# Patient Record
Sex: Male | Born: 2000 | Race: White | Hispanic: No | Marital: Single | Smoking: Never smoker
Health system: Southern US, Community
[De-identification: ages and names within clinical notes are randomized; demographics above are authoritative.]

## PROBLEM LIST (undated history)

## (undated) DIAGNOSIS — R011 Cardiac murmur, unspecified: Secondary | ICD-10-CM

## (undated) DIAGNOSIS — Z889 Allergy status to unspecified drugs, medicaments and biological substances status: Secondary | ICD-10-CM

## (undated) DIAGNOSIS — U071 COVID-19: Secondary | ICD-10-CM

## (undated) DIAGNOSIS — F909 Attention-deficit hyperactivity disorder, unspecified type: Secondary | ICD-10-CM

## (undated) HISTORY — PX: WISDOM TOOTH EXTRACTION: SHX21

## (undated) HISTORY — PX: TONSILLECTOMY: SUR1361

---
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
## 2020-10-03 ENCOUNTER — Other Ambulatory Visit: Payer: Self-pay

## 2020-10-03 ENCOUNTER — Ambulatory Visit
Admission: EM | Admit: 2020-10-03 | Discharge: 2020-10-03 | Discharge status: Against Medical Advice | Payer: PRIVATE HEALTH INSURANCE

## 2021-05-15 ENCOUNTER — Encounter: Payer: Self-pay | Admitting: Emergency Medicine

## 2021-05-15 ENCOUNTER — Other Ambulatory Visit: Payer: Self-pay

## 2021-05-15 ENCOUNTER — Ambulatory Visit
Admission: EM | Admit: 2021-05-15 | Discharge: 2021-05-15 | Disposition: A | Discharge status: Home / Self Care | Payer: 59 | Attending: Internal Medicine | Admitting: Internal Medicine

## 2021-05-15 DIAGNOSIS — H02841 Edema of right upper eyelid: Secondary | ICD-10-CM | POA: Diagnosis not present

## 2021-05-15 MED ORDER — ERYTHROMYCIN 5 MG/GM OP OINT: TOPICAL_OINTMENT | OPHTHALMIC | 0 refills | Status: DC

## 2021-05-15 MED ORDER — AMOXICILLIN-POT CLAVULANATE 875-125 MG PO TABS: 1.0000 | ORAL_TABLET | Freq: Two times a day (BID) | ORAL | 0 refills | Status: DC

## 2021-05-15 NOTE — ED Triage Notes (Signed)
While at work yesterday his right eye began to hurt. Denies having gotten anything in his eye. Woke up this morning with swelling all the way around his right eye to where he can barely open the lids with continued pain. Sclera and conjunctiva do not appear injected, no visible drainage. Denies visual changes aside from the eyelid impediment

## 2021-05-15 NOTE — ED Provider Notes (Signed)
[facility]    CSN: [id number] Arrival date & time: 05/15/21  0919      History   Chief Complaint Chief Complaint  Patient presents with   Facial Swelling    HPI Keith Mcconnell is a 21 y.o. male.   Patient presents with right upper eyelid swelling that started yesterday.  He has associated pain with it as well.  Denies pain with extraocular movement.  Denies any drainage from the eye.  Denies blurry vision.  Denies any trauma or foreign body to the eye.  Patient does not wear contacts.    Past Medical History:  Diagnosis Date   ADHD    COVID-19    Heart murmur    Hx of seasonal allergies     There are no problems to display for this patient.   Past Surgical History:  Procedure Laterality Date   TONSILLECTOMY         Home Medications    Prior to Admission medications   Medication Sig Start Date End Date Taking? Authorizing Provider  amoxicillin-clavulanate (AUGMENTIN) 875-125 MG tablet Take 1 tablet by mouth every 12 (twelve) hours. 05/15/21  Yes Meiya Wisler, Hildred Alamin E, FNP  erythromycin ophthalmic ointment Place a 1/2 inch ribbon of ointment into the eye 4 times daily for 7 days. 05/15/21  Yes Chonte Ricke, Hildred Alamin E, FNP  lisdexamfetamine (VYVANSE) 20 MG capsule Take 20 mg by mouth daily.    [provider]  predniSONE (DELTASONE) 20 MG tablet Take 40 mg by mouth daily for 3 days, then 20mg  by mouth daily for 3 days, then 10mg  daily for 3 days 11/22/19   Larene Pickett, PA-C  triamcinolone cream (KENALOG) 0.1 % Apply 1 application topically 2 (two) times daily. 11/09/19   Ok Edwards, PA-C    Family History Family History  Problem Relation Age of Onset   Healthy Mother    Healthy Father     Social History Social History   Tobacco Use   Smoking status: Never   Smokeless tobacco: Never  Vaping Use   Vaping Use: Some days  Substance Use Topics   Alcohol use: Yes    Comment: occasionally   Drug use: No     Allergies   Patient has no known  allergies.   Review of Systems Review of Systems Per HPI  Physical Exam Triage Vital Signs ED Triage Vitals  Enc Vitals Group     BP 05/15/21 0955 116/74     Pulse Rate 05/15/21 0955 68     Resp 05/15/21 0955 16     Temp 05/15/21 0955 98.4 F (36.9 C)     Temp Source 05/15/21 0955 Oral     SpO2 05/15/21 0955 96 %     Weight --      Height --      Head Circumference --      Peak Flow --      Pain Score 05/15/21 0956 7     Pain Loc --      Pain Edu? --      Excl. in [place]? --    No data found.  Updated Vital Signs BP 116/74 (BP Location: Left Arm)    Pulse 68    Temp 98.4 F (36.9 C) (Oral)    Resp 16    SpO2 96%   Visual Acuity Right Eye Distance: 20/20 Left Eye Distance: 20/25 Bilateral Distance: 20/20  Right Eye Near:   Left Eye Near:    Bilateral Near:  Physical Exam Constitutional:      General: He is not in acute distress.    Appearance: Normal appearance. He is not toxic-appearing or diaphoretic.  HENT:     Head: Normocephalic and atraumatic.  Eyes:     General: Lids are everted, no foreign bodies appreciated. Vision grossly intact. Gaze aligned appropriately.        Right eye: No foreign body or discharge.        Left eye: No foreign body or discharge.     Extraocular Movements: Extraocular movements intact.     Conjunctiva/sclera: Conjunctivae normal.     Pupils: Pupils are equal, round, and reactive to light.     Comments: Sclera and conjunctive are normal.  No drainage from eye.  No pain with extraocular movements for this.  Mild swelling with mild erythema noted to right upper eyelid.  Pulmonary:     Effort: Pulmonary effort is normal.  Neurological:     General: No focal deficit present.     Mental Status: He is alert and oriented to person, place, and time. Mental status is at baseline.  Psychiatric:        Mood and Affect: Mood normal.        Behavior: Behavior normal.        Thought Content: Thought content normal.        Judgment:  Judgment normal.     UC Treatments / Results  Labs (all labs ordered are listed, but only abnormal results are displayed) Labs Reviewed - No data to display  EKG   Radiology No results found.  Procedures Procedures (including critical care time)  Medications Ordered in UC Medications - No data to display  Initial Impression / Assessment and Plan / UC Course  I have reviewed the triage vital signs and the nursing notes.  Pertinent labs & imaging results that were available during my care of the patient were reviewed by me and considered in my medical decision making (see chart for details).     Differential diagnoses include preseptal cellulitis versus hordeolum.  No concern for orbital cellulitis.  There is no obvious stye on exam but there is concern. Will prescribe Augmentin antibiotic as well as erythromycin ointment to cover for both etiologies.  Patient to use compresses as well.  Advised patient to follow-up with eye doctor if symptoms persist or worsen.  Visual acuity is normal today.  Discussed return precautions.  Patient verbalized understanding and was agreeable with plan.  Final diagnoses:  Swelling of right upper eyelid     Discharge Instructions      It appears that you have an infection of your right upper eyelid.  This is being treated with antibiotic ointment as well as antibiotic pills.  Please follow-up with eye doctor if symptoms persist or worsen.    ED Prescriptions     Medication Sig Dispense Auth. Provider   amoxicillin-clavulanate (AUGMENTIN) 875-125 MG tablet Take 1 tablet by mouth every 12 (twelve) hours. 14 tablet [place], [place] E, [place]   erythromycin ophthalmic ointment Place a 1/2 inch ribbon of ointment into the eye 4 times daily for 7 days. 3.5 g Teodora Medici, [place]      PDMP not reviewed this encounter.   Teodora Medici, [place] 05/15/21 1021

## 2021-05-15 NOTE — Discharge Instructions (Signed)
It appears that you have an infection of your right upper eyelid.  This is being treated with antibiotic ointment as well as antibiotic pills.  Please follow-up with eye doctor if symptoms persist or worsen.

## 2023-04-28 ENCOUNTER — Emergency Department (HOSPITAL_BASED_OUTPATIENT_CLINIC_OR_DEPARTMENT_OTHER)
Admission: EM | Admit: 2023-04-28 | Discharge: 2023-04-28 | Disposition: A | Discharge status: Home / Self Care | Payer: Self-pay | Attending: Emergency Medicine | Admitting: Emergency Medicine

## 2023-04-28 ENCOUNTER — Other Ambulatory Visit: Payer: Self-pay

## 2023-04-28 ENCOUNTER — Emergency Department (HOSPITAL_BASED_OUTPATIENT_CLINIC_OR_DEPARTMENT_OTHER): Payer: Self-pay | Admitting: Radiology

## 2023-04-28 ENCOUNTER — Encounter (HOSPITAL_BASED_OUTPATIENT_CLINIC_OR_DEPARTMENT_OTHER): Payer: Self-pay | Admitting: Emergency Medicine

## 2023-04-28 DIAGNOSIS — J101 Influenza due to other identified influenza virus with other respiratory manifestations: Secondary | ICD-10-CM | POA: Insufficient documentation

## 2023-04-28 DIAGNOSIS — Z20822 Contact with and (suspected) exposure to covid-19: Secondary | ICD-10-CM | POA: Insufficient documentation

## 2023-04-28 LAB — BASIC METABOLIC PANEL
Anion gap: 10 (ref 5–15)
BUN: 11 mg/dL (ref 6–20)
CO2: 25 mmol/L (ref 22–32)
Calcium: 10.1 mg/dL (ref 8.9–10.3)
Chloride: 99 mmol/L (ref 98–111)
Creatinine, Ser: 1.13 mg/dL (ref 0.61–1.24)
GFR, Estimated: >60 mL/min (ref >60)
Glucose, Bld: 136 mg/dL — ABNORMAL HIGH (ref 70–99)
Potassium: 4.2 mmol/L (ref 3.5–5.1)
Sodium: 134 mmol/L — ABNORMAL LOW (ref 135–145)

## 2023-04-28 LAB — CBC
HCT: 43 % (ref 39.0–52.0)
Hemoglobin: 14.8 g/dL (ref 13.0–17.0)
MCH: 30.9 pg (ref 26.0–34.0)
MCHC: 34.4 g/dL (ref 30.0–36.0)
MCV: 89.8 fL (ref 80.0–100.0)
Platelets: 164 10*3/uL (ref 150–400)
RBC: 4.79 MIL/uL (ref 4.22–5.81)
RDW: 12.2 % (ref 11.5–15.5)
WBC: 5.6 10*3/uL (ref 4.0–10.5)
nRBC: 0 % (ref 0.0–0.2)

## 2023-04-28 LAB — RESP PANEL BY RT-PCR (RSV, FLU A&B, COVID)  RVPGX2
Influenza A by PCR: POSITIVE — AB
Influenza B by PCR: NEGATIVE
Resp Syncytial Virus by PCR: NEGATIVE
SARS Coronavirus 2 by RT PCR: NEGATIVE

## 2023-04-28 LAB — TROPONIN I (HIGH SENSITIVITY)
Troponin I (High Sensitivity): <2 ng/L (ref <18)
Troponin I (High Sensitivity): 2 ng/L (ref <18)

## 2023-04-28 MED ORDER — ACETAMINOPHEN 500 MG PO TABS: 500.0000 mg | ORAL_TABLET | Freq: Four times a day (QID) | ORAL | 0 refills | Status: AC | PRN

## 2023-04-28 MED ORDER — ACETAMINOPHEN 500 MG PO TABS
1000.0000 mg | ORAL_TABLET | Freq: Once | ORAL | Status: AC
  Administered 2023-04-28: 1000 mg via ORAL
  Filled 2023-04-28: qty 2

## 2023-04-28 MED ORDER — SODIUM CHLORIDE 0.9 % IV BOLUS
1000.0000 mL | Freq: Once | INTRAVENOUS | Status: AC
  Administered 2023-04-28: 1000 mL via INTRAVENOUS

## 2023-04-28 MED ORDER — BENZONATATE 100 MG PO CAPS: 100.0000 mg | ORAL_CAPSULE | Freq: Three times a day (TID) | ORAL | 0 refills | Status: AC

## 2023-04-28 MED ORDER — OSELTAMIVIR PHOSPHATE 75 MG PO CAPS: 75.0000 mg | ORAL_CAPSULE | Freq: Two times a day (BID) | ORAL | 0 refills | Status: DC

## 2023-04-28 NOTE — ED Provider Notes (Signed)
[place] EMERGENCY DEPARTMENT AT [facility] Provider Note   CSN: [id number] Arrival date & time: 04/28/23  1750     History  Chief Complaint  Patient presents with   Cough   Chills    Keith Mcconnell is a 23 y.o. male.  The history is provided by the patient and a parent. No language interpreter was used.  Cough    23 year old male history of heart murmur presenting with flulike symptoms.  Report for the past 2  days he has had a nonproductive cough but today he developed fever, chills, body aches, congestion, decrease in appetite, and feels very rundown.  He denies any recent sick contact.  He denies any vomiting or diarrhea.  He denies any specific treatment tried.  Home Medications Prior to Admission medications   Medication Sig Start Date End Date Taking? Authorizing Provider  amoxicillin-clavulanate (AUGMENTIN) 875-125 MG tablet Take 1 tablet by mouth every 12 (twelve) hours. 05/15/21   Gustavus Bryant, FNP  erythromycin ophthalmic ointment Place a 1/2 inch ribbon of ointment into the eye 4 times daily for 7 days. 05/15/21   Gustavus Bryant, FNP  lisdexamfetamine (VYVANSE) 20 MG capsule Take 20 mg by mouth daily.    [provider]  predniSONE (DELTASONE) 20 MG tablet Take 40 mg by mouth daily for 3 days, then 20mg  by mouth daily for 3 days, then 10mg  daily for 3 days 11/22/19   Garlon Hatchet, PA-C  triamcinolone cream (KENALOG) 0.1 % Apply 1 application topically 2 (two) times daily. 11/09/19   Belinda Fisher, PA-C      Allergies    Patient has no known allergies.    Review of Systems   Review of Systems  Respiratory:  Positive for cough.   All other systems reviewed and are negative.   Physical Exam Updated Vital Signs BP 126/75 (BP Location: Left Arm)   Pulse (!) 137   Temp 99.4 F (37.4 C)   Resp 16   Wt 56 kg   SpO2 99%   BMI 19.91 kg/m  Physical Exam Vitals and nursing note reviewed.  Constitutional:      General: He is not in acute  distress.    Appearance: He is well-developed.  HENT:     Head: Atraumatic.  Eyes:     Conjunctiva/sclera: Conjunctivae normal.  Cardiovascular:     Rate and Rhythm: Tachycardia present.     Pulses: Normal pulses.     Heart sounds: Normal heart sounds.  Pulmonary:     Effort: Pulmonary effort is normal.     Breath sounds: Normal breath sounds. No wheezing, rhonchi or rales.  Abdominal:     Palpations: Abdomen is soft.     Tenderness: There is no abdominal tenderness.  Musculoskeletal:     Cervical back: Neck supple.  Skin:    Findings: No rash.  Neurological:     Mental Status: He is alert.     ED Results / Procedures / Treatments   Labs (all labs ordered are listed, but only abnormal results are displayed) Labs Reviewed  RESP PANEL BY RT-PCR (RSV, FLU A&B, COVID)  RVPGX2 - Abnormal; Notable for the following components:      Result Value   Influenza A by PCR POSITIVE (*)    All other components within normal limits  BASIC METABOLIC PANEL - Abnormal; Notable for the following components:   Sodium 134 (*)    Glucose, Bld 136 (*)    All other components  within normal limits  CBC  TROPONIN I (HIGH SENSITIVITY)  TROPONIN I (HIGH SENSITIVITY)    EKG EKG Interpretation Date/Time:  Wednesday April 28 2023 18:12:18 EST Ventricular Rate:  130 PR Interval:  144 QRS Duration:  146 QT Interval:  384 QTC Calculation: 565 R Axis:   38  Text Interpretation: Sinus tachycardia Non-specific intra-ventricular conduction block  Abnormal ECG  Confirmed by Vonita Moss [phone]) on 04/28/2023 6:55:34 PM  Radiology DG Chest 2 View Result Date: 04/28/2023 CLINICAL DATA:  Tachycardia, fever, chills, and cough. EXAM: CHEST - 2 VIEW COMPARISON:  03/08/2016. FINDINGS: The heart size and mediastinal contours are within normal limits. Both lungs are clear. No acute osseous abnormality. IMPRESSION: No active cardiopulmonary disease. Electronically Signed   By: Thornell Sartorius M.D.   On:  04/28/2023 19:56    Procedures Procedures    Medications Ordered in ED Medications  acetaminophen (TYLENOL) tablet 1,000 mg (1,000 mg Oral Given 04/28/23 1914)  sodium chloride 0.9 % bolus 1,000 mL (0 mLs Intravenous Stopped 04/28/23 2039)    ED Course/ Medical Decision Making/ A&P                                 Medical Decision Making Amount and/or Complexity of Data Reviewed Labs: ordered. Radiology: ordered.  Risk OTC drugs.   BP 126/75 (BP Location: Left Arm)   Pulse (!) 137   Temp 99.4 F (37.4 C)   Resp 16   Wt 56 kg   SpO2 99%   BMI 19.91 kg/m   19:47 PM  23 year old male history of heart murmur presenting with flulike symptoms.  Report for the past 2 to 3 days he has had a nonproductive cough but today he developed fever, chills, body aches, congestion, decrease in appetite, and feels very rundown.  He denies any recent sick contact.  He denies any vomiting or diarrhea.  He denies any specific treatment tried.  Exam notable for tachycardia but patient does not appear dehydrated, no nuchal rigidity concerning for meningitis, lungs otherwise clear no wheezes rales or rhonchi heard.  Vitals are notable for oral temperature of 99.4, heart rate of 137.  Patient is not hypoxic.  -Labs ordered, independently viewed and interpreted by me.  Labs remarkable for influenza A positive -The patient was maintained on a cardiac monitor.  I personally viewed and interpreted the cardiac monitored which showed an underlying rhythm of: sinus tachycardia -Imaging independently viewed and interpreted by me and I agree with radiologist's interpretation.  Result remarkable for CXR unremarkable -This patient presents to the ED for concern of flu sxs, this involves an extensive number of treatment options, and is a complaint that carries with it a high risk of complications and morbidity.  The differential diagnosis includes flu, covid, rsv, pneumonia, viral illness -Co morbidities that  complicate the patient evaluation includes heart murmur -Treatment includes IVF, tylenol -Reevaluation of the patient after these medicines showed that the patient improved -PCP office notes or outside notes reviewed -Escalation to admission/observation considered: patients feels much better, is comfortable with discharge, and will follow up with PCP -Prescription medication considered, patient comfortable with tamiflu and tylenol and tessalone -Social Determinant of Health considered          Final Clinical Impression(s) / ED Diagnoses Final diagnoses:  Influenza A    Rx / DC Orders ED Discharge Orders          Ordered  oseltamivir (TAMIFLU) 75 MG capsule  Every 12 hours        04/28/23 2135    benzonatate (TESSALON) 100 MG capsule  Every 8 hours        04/28/23 2135    acetaminophen (TYLENOL) 500 MG tablet  Every 6 hours PRN        04/28/23 2135              Fayrene Helper, PA-C 04/28/23 2136    Rondel Baton, MD 04/29/23 1524

## 2023-04-28 NOTE — ED Triage Notes (Signed)
Pt via pov from home with fever, chills, cough and high heart rate today. Pt has hx of heart murmur. Pt alert & oriented, nad noted.

## 2023-04-28 NOTE — Discharge Instructions (Addendum)
You have test positive for influenza A.  Take medication prescribed as treatment for your symptoms.  Your condition usually last for 1 to 2 weeks.  Return if you have other concern.

## 2023-06-07 ENCOUNTER — Emergency Department (HOSPITAL_BASED_OUTPATIENT_CLINIC_OR_DEPARTMENT_OTHER): Payer: Self-pay | Admitting: Radiology

## 2023-06-07 ENCOUNTER — Encounter (HOSPITAL_BASED_OUTPATIENT_CLINIC_OR_DEPARTMENT_OTHER): Payer: Self-pay | Admitting: Emergency Medicine

## 2023-06-07 DIAGNOSIS — S82841A Displaced bimalleolar fracture of right lower leg, initial encounter for closed fracture: Secondary | ICD-10-CM | POA: Insufficient documentation

## 2023-06-07 DIAGNOSIS — Y9361 Activity, american tackle football: Secondary | ICD-10-CM | POA: Insufficient documentation

## 2023-06-07 DIAGNOSIS — X509XXA Other and unspecified overexertion or strenuous movements or postures, initial encounter: Secondary | ICD-10-CM | POA: Insufficient documentation

## 2023-06-07 DIAGNOSIS — S82831A Other fracture of upper and lower end of right fibula, initial encounter for closed fracture: Secondary | ICD-10-CM | POA: Insufficient documentation

## 2023-06-07 NOTE — ED Triage Notes (Signed)
 Rt foot/ ankle injury last week Continued pain and bruising/swelling Came into triage with ace wrap and crutches Unable to put pressure on

## 2023-06-08 ENCOUNTER — Emergency Department (HOSPITAL_BASED_OUTPATIENT_CLINIC_OR_DEPARTMENT_OTHER): Payer: Self-pay

## 2023-06-08 ENCOUNTER — Other Ambulatory Visit: Payer: Self-pay

## 2023-06-08 ENCOUNTER — Emergency Department (HOSPITAL_BASED_OUTPATIENT_CLINIC_OR_DEPARTMENT_OTHER)
Admission: EM | Admit: 2023-06-08 | Discharge: 2023-06-08 | Disposition: A | Discharge status: Home / Self Care | Payer: Self-pay | Attending: Emergency Medicine | Admitting: Emergency Medicine

## 2023-06-08 DIAGNOSIS — S82831A Other fracture of upper and lower end of right fibula, initial encounter for closed fracture: Secondary | ICD-10-CM

## 2023-06-08 DIAGNOSIS — S82841A Displaced bimalleolar fracture of right lower leg, initial encounter for closed fracture: Secondary | ICD-10-CM

## 2023-06-08 MED ORDER — OXYCODONE-ACETAMINOPHEN 5-325 MG PO TABS
1.0000 | ORAL_TABLET | Freq: Once | ORAL | Status: AC
  Administered 2023-06-08: 1 via ORAL
  Filled 2023-06-08: qty 1

## 2023-06-08 MED ORDER — OXYCODONE-ACETAMINOPHEN 5-325 MG PO TABS: 1.0000 | ORAL_TABLET | Freq: Four times a day (QID) | ORAL | 0 refills | Status: AC | PRN

## 2023-06-08 NOTE — ED Provider Notes (Signed)
 [place] EMERGENCY DEPARTMENT AT [facility] Provider Note   CSN: [id number] Arrival date & time: 06/07/23  2006     History  Chief Complaint  Patient presents with   Foot Injury    Keith Mcconnell is a 23 y.o. male.   Foot Injury    23 year old male presenting to the emergency department with an ankle and foot injury.  The patient states that he was playing football last week when another individual landed on his right ankle.  He has been unable to bear weight on his right foot or ankle.  He endorses continued pain and bruising with associated swelling.  He denies any other injuries or complaints.  Home Medications Prior to Admission medications   Medication Sig Start Date End Date Taking? Authorizing Provider  oxyCODONE-acetaminophen (PERCOCET/ROXICET) 5-325 MG tablet Take 1 tablet by mouth every 6 (six) hours as needed for severe pain (pain score 7-10). 06/08/23  Yes Ernie Avena, MD  acetaminophen (TYLENOL) 500 MG tablet Take 1 tablet (500 mg total) by mouth every 6 (six) hours as needed. 04/28/23   Fayrene Helper, PA-C  amoxicillin-clavulanate (AUGMENTIN) 875-125 MG tablet Take 1 tablet by mouth every 12 (twelve) hours. 05/15/21   Gustavus Bryant, FNP  benzonatate (TESSALON) 100 MG capsule Take 1 capsule (100 mg total) by mouth every 8 (eight) hours. 04/28/23   Fayrene Helper, PA-C  erythromycin ophthalmic ointment Place a 1/2 inch ribbon of ointment into the eye 4 times daily for 7 days. 05/15/21   Gustavus Bryant, FNP  lisdexamfetamine (VYVANSE) 20 MG capsule Take 20 mg by mouth daily.    [provider]  oseltamivir (TAMIFLU) 75 MG capsule Take 1 capsule (75 mg total) by mouth every 12 (twelve) hours. 04/28/23   Fayrene Helper, PA-C  predniSONE (DELTASONE) 20 MG tablet Take 40 mg by mouth daily for 3 days, then 20mg  by mouth daily for 3 days, then 10mg  daily for 3 days 11/22/19   Garlon Hatchet, PA-C  triamcinolone cream (KENALOG) 0.1 % Apply 1 application topically 2  (two) times daily. 11/09/19   Belinda Fisher, PA-C      Allergies    Patient has no known allergies.    Review of Systems   Review of Systems  All other systems reviewed and are negative.   Physical Exam Updated Vital Signs BP 112/68   Pulse 80   Temp 98.5 F (36.9 C) (Oral)   Resp 18   SpO2 100%  Physical Exam Vitals and nursing note reviewed.  Constitutional:      General: He is not in acute distress. HENT:     Head: Normocephalic and atraumatic.  Eyes:     Conjunctiva/sclera: Conjunctivae normal.     Pupils: Pupils are equal, round, and reactive to light.  Cardiovascular:     Rate and Rhythm: Normal rate and regular rhythm.  Pulmonary:     Effort: Pulmonary effort is normal. No respiratory distress.  Abdominal:     General: There is no distension.     Tenderness: There is no guarding.  Musculoskeletal:        General: Swelling and tenderness present.     Cervical back: Neck supple.     Comments: 2+ DP pulses, ecchymoses and swelling of the dorsum of the right foot as well as lateral TTP and swelling of the ankle.  Skin:    Findings: No lesion or rash.  Neurological:     General: No focal deficit present.  Mental Status: He is alert. Mental status is at baseline.     ED Results / Procedures / Treatments   Labs (all labs ordered are listed, but only abnormal results are displayed) Labs Reviewed - No data to display  EKG None  Radiology CT Ankle Right Wo Contrast Result Date: 06/08/2023 CLINICAL DATA:  Ankle trauma, fracture, xray done (Age >= 5y); Foot trauma, occult fracture suspected, xray equivocal (Age >= 6y) EXAM: CT OF THE RIGHT ANKLE and FOOT WITHOUT CONTRAST TECHNIQUE: Multidetector CT imaging of the right ankle was performed according to the standard protocol. Multiplanar CT image reconstructions were also generated. RADIATION DOSE REDUCTION: This exam was performed according to the departmental dose-optimization program which includes automated  exposure control, adjustment of the mA and/or kV according to patient size and/or use of iterative reconstruction technique. COMPARISON:  X-ray right foot and ankle 06/08/2023 FINDINGS: Bones/Joint/Cartilage Acute displaced fracture of the infra syndesmotic distal fibula. Associated acute nondisplaced fracture of the posterior malleolus. No acute displaced fracture of the bones of the right foot. No dislocation. Ligaments Suboptimally assessed by CT. Muscles and Tendons Grossly unremarkable. Soft tissues Subcutaneus soft tissue edema of the lateral ankle and dorsum of the foot no subcutaneus soft tissue emphysema. No retained radiopaque foreign body. IMPRESSION: 1. Acute displaced fracture of the infra syndesmotic distal fibula. 2. Acute nondisplaced fracture of the posterior malleolus. 3. No acute displaced fracture of the bones of the right foot. 4. No dislocation. Electronically Signed   By: Tish Frederickson M.D.   On: 06/08/2023 03:33   CT Foot Right Wo Contrast Result Date: 06/08/2023 CLINICAL DATA:  Ankle trauma, fracture, xray done (Age >= 5y); Foot trauma, occult fracture suspected, xray equivocal (Age >= 6y) EXAM: CT OF THE RIGHT ANKLE and FOOT WITHOUT CONTRAST TECHNIQUE: Multidetector CT imaging of the right ankle was performed according to the standard protocol. Multiplanar CT image reconstructions were also generated. RADIATION DOSE REDUCTION: This exam was performed according to the departmental dose-optimization program which includes automated exposure control, adjustment of the mA and/or kV according to patient size and/or use of iterative reconstruction technique. COMPARISON:  X-ray right foot and ankle 06/08/2023 FINDINGS: Bones/Joint/Cartilage Acute displaced fracture of the infra syndesmotic distal fibula. Associated acute nondisplaced fracture of the posterior malleolus. No acute displaced fracture of the bones of the right foot. No dislocation. Ligaments Suboptimally assessed by CT. Muscles  and Tendons Grossly unremarkable. Soft tissues Subcutaneus soft tissue edema of the lateral ankle and dorsum of the foot no subcutaneus soft tissue emphysema. No retained radiopaque foreign body. IMPRESSION: 1. Acute displaced fracture of the infra syndesmotic distal fibula. 2. Acute nondisplaced fracture of the posterior malleolus. 3. No acute displaced fracture of the bones of the right foot. 4. No dislocation. Electronically Signed   By: Tish Frederickson M.D.   On: 06/08/2023 03:33   DG Foot Complete Right Result Date: 06/07/2023 CLINICAL DATA:  Pain after injury.  Bruising and swelling. EXAM: RIGHT FOOT COMPLETE - 3+ VIEW; RIGHT ANKLE - COMPLETE 3+ VIEW COMPARISON:  None Available. FINDINGS: Ankle: Displaced distal fibular fracture at the level of the ankle mortise. No additional fracture of the ankle. No mortise widening. Associated soft tissue edema. Talar dome is intact. Foot: No acute fracture of the foot. The alignment and joint spaces are normal. The distal second toe is excluded on the lateral view. Incidental os navicular. IMPRESSION: 1. Displaced distal fibular fracture at the level of the ankle mortise. 2. No acute fracture of the foot. Electronically  Signed   By: Narda Rutherford M.D.   On: 06/07/2023 21:45   DG Ankle Complete Right Result Date: 06/07/2023 CLINICAL DATA:  Pain after injury.  Bruising and swelling. EXAM: RIGHT FOOT COMPLETE - 3+ VIEW; RIGHT ANKLE - COMPLETE 3+ VIEW COMPARISON:  None Available. FINDINGS: Ankle: Displaced distal fibular fracture at the level of the ankle mortise. No additional fracture of the ankle. No mortise widening. Associated soft tissue edema. Talar dome is intact. Foot: No acute fracture of the foot. The alignment and joint spaces are normal. The distal second toe is excluded on the lateral view. Incidental os navicular. IMPRESSION: 1. Displaced distal fibular fracture at the level of the ankle mortise. 2. No acute fracture of the foot. Electronically Signed    By: Narda Rutherford M.D.   On: 06/07/2023 21:45    Procedures Procedures    Medications Ordered in ED Medications  oxyCODONE-acetaminophen (PERCOCET/ROXICET) 5-325 MG per tablet 1 tablet (1 tablet Oral Given 06/08/23 0035)    ED Course/ Medical Decision Making/ A&P                                 Medical Decision Making Amount and/or Complexity of Data Reviewed Radiology: ordered.  Risk Prescription drug management.    23 year old male presenting to the emergency department with an ankle and foot injury.  The patient states that he was playing football last week when another individual landed on his right ankle.  He has been unable to bear weight on his right foot or ankle.  He endorses continued pain and bruising with associated swelling.  He denies any other injuries or complaints.  On arrival, the patient was vitally stable.  Provided Percocet for pain control.  Presenting with concern for a 33 week old foot and ankle injury.  Not able to bear weight.  Physical exam as per above concerning for injury.  A x-ray was performed of the foot and ankle which revealed a displaced distal fibular fracture at the level of the ankle mortisse with no acute fracture in the foot noted.  CT Foot and Ankle performed.  The ankle mortise is intact.  Pt placed in a short leg splint.  He is neurovascularly intact.  He has crutches already.  I advised that he follow-up outpatient with orthopedics for further management.  CT imaging did show evidence of a bimalleolar ankle fracture.  IMPRESSION:  1. Acute displaced fracture of the infra syndesmotic distal fibula.  2. Acute nondisplaced fracture of the posterior malleolus.  3. No acute displaced fracture of the bones of the right foot.  4. No dislocation.   Stable for DC, NVI status post splint placement, stable for ortho follow-up.   Final Clinical Impression(s) / ED Diagnoses Final diagnoses:  Closed fracture of distal end of right fibula,  unspecified fracture morphology, initial encounter  Closed bimalleolar fracture of right ankle, initial encounter    Rx / DC Orders ED Discharge Orders          Ordered    AMB referral to orthopedics        06/08/23 0115    oxyCODONE-acetaminophen (PERCOCET/ROXICET) 5-325 MG tablet  Every 6 hours PRN        06/08/23 0116              Ernie Avena, MD 06/08/23 [phone]

## 2023-06-08 NOTE — Discharge Instructions (Addendum)
 You have a distal fibular fracture which is a fracture of your ankle.  This fracture is stable but requires immobilization and splinting.  You need to follow-up outpatient with orthopedics.  Pain medicine has been prescribed.  Utilize crutches for ambulation.

## 2023-06-16 ENCOUNTER — Encounter (HOSPITAL_COMMUNITY): Payer: Self-pay | Admitting: Orthopaedic Surgery

## 2023-06-16 ENCOUNTER — Other Ambulatory Visit: Payer: Self-pay

## 2023-06-16 ENCOUNTER — Ambulatory Visit: Payer: Self-pay | Admitting: Orthopaedic Surgery

## 2023-06-16 DIAGNOSIS — S82841A Displaced bimalleolar fracture of right lower leg, initial encounter for closed fracture: Secondary | ICD-10-CM | POA: Insufficient documentation

## 2023-06-16 NOTE — Progress Notes (Signed)
 Office Visit Note   Patient: Keith Mcconnell           Date of Birth: 10-02-2000           MRN: [id number] Visit Date: 06/16/2023              Requested by: Ernie Avena, MD [address] [place],  [place] [address] PCP: [facility]   Assessment & Plan: Visit Diagnoses:  1. Bimalleolar ankle fracture, right, closed, initial encounter     Plan: Keith Mcconnell is a 23 year old with a displaced right bimalleolar ankle fracture.  Based on findings I recommended surgical repair.  Details of the surgery including risk benefits prognosis reviewed with the patient.  All questions answered.  We placed him in a cam boot.  Prescription for knee scooter.  Plan for surgery tomorrow.  Follow-Up Instructions: No follow-ups on file.   Orders:  No orders of the defined types were placed in this encounter.  No orders of the defined types were placed in this encounter.     Procedures: No procedures performed   Clinical Data: No additional findings.   Subjective: Chief Complaint  Patient presents with   Right Foot - Pain    HPI Keith Mcconnell is a 23 year old male who is a follow-up from the ER from 06/08/2023.  He was playing football and injured his ankle.  X-rays and CT scan showed bimalleolar ankle fracture.  His dad is here with him.  He has been in a splint and on crutches. Review of Systems  Constitutional: Negative.   HENT: Negative.    Eyes: Negative.   Respiratory: Negative.    Cardiovascular: Negative.   Gastrointestinal: Negative.   Endocrine: Negative.   Genitourinary: Negative.   Skin: Negative.   Allergic/Immunologic: Negative.   Neurological: Negative.   Hematological: Negative.   Psychiatric/Behavioral: Negative.    All other systems reviewed and are negative.    Objective: Vital Signs: There were no vitals taken for this visit.  Physical Exam Vitals and nursing note reviewed.  Constitutional:      Appearance: He is well-developed.  HENT:      Head: Normocephalic and atraumatic.  Eyes:     Pupils: Pupils are equal, round, and reactive to light.  Pulmonary:     Effort: Pulmonary effort is normal.  Abdominal:     Palpations: Abdomen is soft.  Musculoskeletal:        General: Normal range of motion.     Cervical back: Neck supple.  Skin:    General: Skin is warm.  Neurological:     Mental Status: He is alert and oriented to person, place, and time.  Psychiatric:        Behavior: Behavior normal.        Thought Content: Thought content normal.        Judgment: Judgment normal.     Ortho Exam Examination of the right ankle shows mild swelling.  The skin wrinkles.  No neurovascular compromise. Specialty Comments:  No specialty comments available.  Imaging: No results found.   PMFS History: Patient Active Problem List   Diagnosis Date Noted   Bimalleolar ankle fracture, right, closed, initial encounter 06/16/2023   Past Medical History:  Diagnosis Date   ADHD    no meds   COVID-19 2021   Heart murmur    never has caused any problems   Hx of seasonal allergies     Family History  Problem Relation Age of Onset  Healthy Mother    Healthy Father     Past Surgical History:  Procedure Laterality Date   TONSILLECTOMY     WISDOM TOOTH EXTRACTION     Social History   Occupational History   Not on file  Tobacco Use   Smoking status: Never   Smokeless tobacco: Never  Vaping Use   Vaping status: Every Day   Substances: Nicotine, Flavoring  Substance and Sexual Activity   Alcohol use: Yes    Alcohol/week: 35.0 standard drinks of alcohol    Types: 35 Cans of beer per week    Comment: 5 per night   Drug use: No   Sexual activity: Not Currently

## 2023-06-16 NOTE — Progress Notes (Signed)
 PCP - none Cardiologist - none  Chest x-ray - 04/28/23 EKG - 04/28/23 Stress Test - n/a ECHO - n/a Cardiac Cath - n/a  ICD Pacemaker/Loop - n/a  Sleep Study -  n/a  Diabetes - n/a  Aspirin and Blood Thinner Instructions:  n/a  ERAS - clear liquids til 11:15 AM DOS  Anesthesia review: no  STOP now taking any Aspirin (unless otherwise instructed by your surgeon), Aleve, Naproxen, Ibuprofen, Motrin, Advil, Goody's, BC's, all herbal medications, fish oil, and all vitamins.   Coronavirus Screening Do you have any of the following symptoms:  Cough yes/no: No Fever (>100.32F)  yes/no: No Runny nose yes/no: No Sore throat yes/no: No Difficulty breathing/shortness of breath  yes/no: No  Have you traveled in the last 14 days and where? yes/no: No  Patient verbalized understanding of instructions that were given via phone.

## 2023-06-17 ENCOUNTER — Encounter (HOSPITAL_COMMUNITY): Payer: Self-pay | Admitting: Orthopaedic Surgery

## 2023-06-17 ENCOUNTER — Other Ambulatory Visit: Payer: Self-pay | Admitting: Physician Assistant

## 2023-06-17 ENCOUNTER — Ambulatory Visit (HOSPITAL_COMMUNITY): Payer: Self-pay

## 2023-06-17 ENCOUNTER — Encounter (HOSPITAL_COMMUNITY)
Admission: RE | Disposition: A | Discharge status: Home / Self Care | Payer: Self-pay | Source: Home / Self Care | Attending: Orthopaedic Surgery

## 2023-06-17 ENCOUNTER — Ambulatory Visit (HOSPITAL_COMMUNITY): Payer: Self-pay | Admitting: Anesthesiology

## 2023-06-17 ENCOUNTER — Ambulatory Visit (HOSPITAL_COMMUNITY)
Admission: RE | Admit: 2023-06-17 | Discharge: 2023-06-17 | Disposition: A | Discharge status: Home / Self Care | Payer: Self-pay | Attending: Orthopaedic Surgery | Admitting: Orthopaedic Surgery

## 2023-06-17 ENCOUNTER — Other Ambulatory Visit: Payer: Self-pay

## 2023-06-17 DIAGNOSIS — S82841A Displaced bimalleolar fracture of right lower leg, initial encounter for closed fracture: Secondary | ICD-10-CM | POA: Insufficient documentation

## 2023-06-17 DIAGNOSIS — I38 Endocarditis, valve unspecified: Secondary | ICD-10-CM | POA: Insufficient documentation

## 2023-06-17 DIAGNOSIS — F17290 Nicotine dependence, other tobacco product, uncomplicated: Secondary | ICD-10-CM | POA: Insufficient documentation

## 2023-06-17 HISTORY — PX: ORIF ANKLE FRACTURE: SHX5408

## 2023-06-17 LAB — CBC
HCT: 47.4 % (ref 39.0–52.0)
Hemoglobin: 16 g/dL (ref 13.0–17.0)
MCH: 30.8 pg (ref 26.0–34.0)
MCHC: 33.8 g/dL (ref 30.0–36.0)
MCV: 91.3 fL (ref 80.0–100.0)
Platelets: 375 10*3/uL (ref 150–400)
RBC: 5.19 MIL/uL (ref 4.22–5.81)
RDW: 13 % (ref 11.5–15.5)
WBC: 5.9 10*3/uL (ref 4.0–10.5)
nRBC: 0 % (ref 0.0–0.2)

## 2023-06-17 LAB — SURGICAL PCR SCREEN
MRSA, PCR: NEGATIVE
Staphylococcus aureus: POSITIVE — AB

## 2023-06-17 SURGERY — OPEN REDUCTION INTERNAL FIXATION (ORIF) ANKLE FRACTURE
Anesthesia: General | Site: Ankle | Laterality: Right

## 2023-06-17 MED ORDER — FENTANYL CITRATE (PF) 100 MCG/2ML IJ SOLN: 100.0000 ug | Freq: Once | INTRAMUSCULAR | Status: AC

## 2023-06-17 MED ORDER — DEXMEDETOMIDINE HCL IN NACL 80 MCG/20ML IV SOLN
INTRAVENOUS | Status: DC | PRN
  Administered 2023-06-17: 12 ug via INTRAVENOUS
  Administered 2023-06-17: 8 ug via INTRAVENOUS

## 2023-06-17 MED ORDER — 0.9 % SODIUM CHLORIDE (POUR BTL) OPTIME
TOPICAL | Status: DC | PRN
  Administered 2023-06-17: 2000 mL

## 2023-06-17 MED ORDER — DEXAMETHASONE SODIUM PHOSPHATE 10 MG/ML IJ SOLN
INTRAMUSCULAR | Status: AC
  Filled 2023-06-17: qty 1

## 2023-06-17 MED ORDER — BUPIVACAINE-EPINEPHRINE (PF) 0.5% -1:200000 IJ SOLN
INTRAMUSCULAR | Status: DC | PRN
  Administered 2023-06-17: 20 mL via PERINEURAL
  Administered 2023-06-17: 10 mL via PERINEURAL

## 2023-06-17 MED ORDER — CEFAZOLIN SODIUM-DEXTROSE 2-4 GM/100ML-% IV SOLN
INTRAVENOUS | Status: AC
  Filled 2023-06-17: qty 100

## 2023-06-17 MED ORDER — LACTATED RINGERS IV SOLN: INTRAVENOUS | Status: DC

## 2023-06-17 MED ORDER — MIDAZOLAM HCL 2 MG/2ML IJ SOLN: 2.0000 mg | Freq: Once | INTRAMUSCULAR | Status: AC

## 2023-06-17 MED ORDER — PROPOFOL 10 MG/ML IV BOLUS
INTRAVENOUS | Status: AC
  Filled 2023-06-17: qty 20

## 2023-06-17 MED ORDER — CEFAZOLIN SODIUM-DEXTROSE 2-4 GM/100ML-% IV SOLN
2.0000 g | INTRAVENOUS | Status: AC
  Administered 2023-06-17: 2 g via INTRAVENOUS

## 2023-06-17 MED ORDER — ASPIRIN 81 MG PO TBEC: 81.0000 mg | DELAYED_RELEASE_TABLET | Freq: Two times a day (BID) | ORAL | 0 refills | Status: AC

## 2023-06-17 MED ORDER — MIDAZOLAM HCL 2 MG/2ML IJ SOLN
INTRAMUSCULAR | Status: AC
  Administered 2023-06-17: 2 mg via INTRAVENOUS
  Filled 2023-06-17: qty 2

## 2023-06-17 MED ORDER — CHLORHEXIDINE GLUCONATE 0.12 % MT SOLN
15.0000 mL | Freq: Once | OROMUCOSAL | Status: AC
  Administered 2023-06-17: 15 mL via OROMUCOSAL
  Filled 2023-06-17: qty 15

## 2023-06-17 MED ORDER — ZINC SULFATE 220 (50 ZN) MG PO CAPS: 220.0000 mg | ORAL_CAPSULE | Freq: Every day | ORAL | 0 refills | Status: AC

## 2023-06-17 MED ORDER — ORAL CARE MOUTH RINSE: 15.0000 mL | Freq: Once | OROMUCOSAL | Status: AC

## 2023-06-17 MED ORDER — ONDANSETRON HCL 4 MG/2ML IJ SOLN
INTRAMUSCULAR | Status: DC | PRN
  Administered 2023-06-17: 4 mg via INTRAVENOUS

## 2023-06-17 MED ORDER — ACETAMINOPHEN 500 MG PO TABS
1000.0000 mg | ORAL_TABLET | Freq: Once | ORAL | Status: AC
  Administered 2023-06-17: 1000 mg via ORAL
  Filled 2023-06-17: qty 2

## 2023-06-17 MED ORDER — LIDOCAINE 2% (20 MG/ML) 5 ML SYRINGE
INTRAMUSCULAR | Status: AC
  Filled 2023-06-17: qty 5

## 2023-06-17 MED ORDER — PROPOFOL 10 MG/ML IV BOLUS
INTRAVENOUS | Status: DC | PRN
  Administered 2023-06-17: 200 mg via INTRAVENOUS

## 2023-06-17 MED ORDER — HYDROCODONE-ACETAMINOPHEN 5-325 MG PO TABS: 1.0000 | ORAL_TABLET | Freq: Every day | ORAL | 0 refills | Status: AC | PRN

## 2023-06-17 MED ORDER — LIDOCAINE 2% (20 MG/ML) 5 ML SYRINGE
INTRAMUSCULAR | Status: DC | PRN
  Administered 2023-06-17: 60 mg via INTRAVENOUS

## 2023-06-17 MED ORDER — FENTANYL CITRATE (PF) 100 MCG/2ML IJ SOLN
INTRAMUSCULAR | Status: AC
  Administered 2023-06-17: 100 ug via INTRAVENOUS
  Filled 2023-06-17: qty 2

## 2023-06-17 MED ORDER — PHENYLEPHRINE HCL (PRESSORS) 10 MG/ML IV SOLN
INTRAVENOUS | Status: DC | PRN
  Administered 2023-06-17 (×3): 80 ug via INTRAVENOUS

## 2023-06-17 MED ORDER — ONDANSETRON HCL 4 MG/2ML IJ SOLN
INTRAMUSCULAR | Status: AC
  Filled 2023-06-17: qty 2

## 2023-06-17 MED ORDER — CLONIDINE HCL (ANALGESIA) 100 MCG/ML EP SOLN
EPIDURAL | Status: DC | PRN
  Administered 2023-06-17: 50 ug

## 2023-06-17 MED ORDER — DEXAMETHASONE SODIUM PHOSPHATE 10 MG/ML IJ SOLN
INTRAMUSCULAR | Status: DC | PRN
  Administered 2023-06-17: 10 mg via INTRAVENOUS

## 2023-06-17 SURGICAL SUPPLY — 62 items
BAG COUNTER SPONGE SURGICOUNT (BAG) ×2 IMPLANT
BANDAGE ESMARK 6X9 LF (GAUZE/BANDAGES/DRESSINGS) IMPLANT
BIT DRILL SHORT 2.0 ZI (BIT) IMPLANT
BIT DRILL SHORT 2.5 (BIT) IMPLANT
BIT DRL SHORT 2.5 (BIT) ×1 IMPLANT
BLADE SURG 15 STRL LF DISP TIS (BLADE) ×2 IMPLANT
BNDG COHESIVE 4X5 TAN STRL LF (GAUZE/BANDAGES/DRESSINGS) ×2 IMPLANT
BNDG ELASTIC 4INX 5YD STR LF (GAUZE/BANDAGES/DRESSINGS) IMPLANT
BNDG ELASTIC 4X5.8 VLCR STR LF (GAUZE/BANDAGES/DRESSINGS) IMPLANT
BNDG ELASTIC 6INX 5YD STR LF (GAUZE/BANDAGES/DRESSINGS) IMPLANT
BNDG ESMARK 6X9 LF (GAUZE/BANDAGES/DRESSINGS) ×1 IMPLANT
CANISTER SUCT 3000ML PPV (MISCELLANEOUS) ×2 IMPLANT
COVER SURGICAL LIGHT HANDLE (MISCELLANEOUS) ×2 IMPLANT
CUFF TOURN SGL QUICK 42 (TOURNIQUET CUFF) IMPLANT
CUFF TRNQT CYL 24X4X40X1 (TOURNIQUET CUFF) IMPLANT
CUFF TRNQT CYL 34X4.125X (TOURNIQUET CUFF) IMPLANT
DRAPE C-ARM 42X72 X-RAY (DRAPES) ×2 IMPLANT
DRAPE C-ARMOR (DRAPES) ×2 IMPLANT
DRAPE INCISE IOBAN 66X45 STRL (DRAPES) IMPLANT
DRAPE U-SHAPE 47X51 STRL (DRAPES) ×2 IMPLANT
DURAPREP 26ML APPLICATOR (WOUND CARE) ×2 IMPLANT
ELECT CAUTERY BLADE 6.4 (BLADE) ×2 IMPLANT
ELECT REM PT RETURN 9FT ADLT (ELECTROSURGICAL) ×1 IMPLANT
ELECTRODE REM PT RTRN 9FT ADLT (ELECTROSURGICAL) ×2 IMPLANT
GAUZE SPONGE 4X4 12PLY STRL (GAUZE/BANDAGES/DRESSINGS) ×2 IMPLANT
GAUZE XEROFORM 5X9 LF (GAUZE/BANDAGES/DRESSINGS) ×2 IMPLANT
GLOVE BIOGEL PI IND STRL 7.0 (GLOVE) ×4 IMPLANT
GLOVE BIOGEL PI IND STRL 7.5 (GLOVE) ×6 IMPLANT
GLOVE ECLIPSE 7.0 STRL STRAW (GLOVE) ×2 IMPLANT
GLOVE SKINSENSE STRL SZ7.5 (GLOVE) ×2 IMPLANT
GLOVE SURG SYN 7.5 E (GLOVE) ×2 IMPLANT
GLOVE SURG SYN 7.5 PF PI (GLOVE) ×4 IMPLANT
GLOVE SURG UNDER POLY LF SZ7 (GLOVE) ×38 IMPLANT
GLOVE SURG UNDER POLY LF SZ7.5 (GLOVE) ×4 IMPLANT
GOWN STRL SURGICAL XL XLNG (GOWN DISPOSABLE) ×2 IMPLANT
K-WIRE OLIVE PLT TACK 2.0-4 SU (WIRE) ×2 IMPLANT
KIT BASIN OR (CUSTOM PROCEDURE TRAY) ×2 IMPLANT
KIT TURNOVER KIT B (KITS) ×2 IMPLANT
KWIRE OLIVE PLT TACK 2.0-4 SU (WIRE) IMPLANT
NDL HYPO 25GX1X1/2 BEV (NEEDLE) IMPLANT
NEEDLE HYPO 25GX1X1/2 BEV (NEEDLE) IMPLANT
NS IRRIG 1000ML POUR BTL (IV SOLUTION) ×2 IMPLANT
PACK ORTHO EXTREMITY (CUSTOM PROCEDURE TRAY) ×2 IMPLANT
PAD ABD 8X10 STRL (GAUZE/BANDAGES/DRESSINGS) IMPLANT
PAD ARMBOARD POSITIONER FOAM (MISCELLANEOUS) ×4 IMPLANT
PAD CAST CTTN 4X4 STRL (SOFTGOODS) IMPLANT
PADDING CAST COTTON 6X4 STRL (CAST SUPPLIES) ×2 IMPLANT
PLATE LAT FIB 6H RT (Plate) IMPLANT
SCREW LOCK MDS 2.7X12 (Screw) IMPLANT
SCREW NON-LOCK 3.5X10 (Screw) IMPLANT
SCREW NON-LOCK 3.5X12 (Screw) IMPLANT
SPLINT FIBERGLASS 4X30 (CAST SUPPLIES) IMPLANT
SUCTION TUBE FRAZIER 10FR DISP (SUCTIONS) ×2 IMPLANT
SUT ETHILON 3 0 PS 1 (SUTURE) IMPLANT
SUT VIC AB 0 CT1 27XBRD ANBCTR (SUTURE) IMPLANT
SUT VIC AB 2-0 CT1 TAPERPNT 27 (SUTURE) IMPLANT
SUT VIC AB 3-0 CT1 TAPERPNT 27 (SUTURE) IMPLANT
SYR CONTROL 10ML LL (SYRINGE) IMPLANT
TOWEL GREEN STERILE (TOWEL DISPOSABLE) ×4 IMPLANT
TOWEL GREEN STERILE FF (TOWEL DISPOSABLE) ×2 IMPLANT
TUBE CONNECTING 12X1/4 (SUCTIONS) ×2 IMPLANT
UNDERPAD 30X36 HEAVY ABSORB (UNDERPADS AND DIAPERS) ×2 IMPLANT

## 2023-06-17 NOTE — Op Note (Signed)
   Date of Surgery: 06/17/2023  INDICATIONS: Keith Mcconnell is a 23 y.o.-year-old male who sustained a right ankle fracture; he was indicated for open reduction and internal fixation due to the displaced nature of the articular fracture and came to the operating room today for this procedure. The patient did consent to the procedure after discussion of the risks and benefits.  PREOPERATIVE DIAGNOSIS: right bimalleolar ankle fracture  POSTOPERATIVE DIAGNOSIS: Same.  PROCEDURE: Open treatment of right ankle fracture with internal fixation. Bimalleolar CPT [phone]  SURGEON: N. Glee Arvin, M.D.  ASSIST: Oneal Grout, PA-C  ANESTHESIA:  general, popliteal block  TOURNIQUET TIME: 27 mins  IV FLUIDS AND URINE: See anesthesia.  ESTIMATED BLOOD LOSS: 50 mL.  IMPLANTS: Zimmer   COMPLICATIONS: see description of procedure.  DESCRIPTION OF PROCEDURE: The patient was brought to the operating room and placed supine on the operating table.  The patient had been signed prior to the procedure and this was documented. The patient had the anesthesia placed by the anesthesiologist.  A nonsterile tourniquet was placed on the upper thigh.  The prep verification and incision time-outs were performed to confirm that this was the correct patient, site, side and location. The patient had an SCD on the opposite lower extremity. The patient did receive antibiotics prior to the incision and was re-dosed during the procedure as needed at indicated intervals.  The patient had the lower extremity prepped and draped in the standard surgical fashion.  The extremity was exsanguinated using an esmarch bandage and the tourniquet was inflated to 250 mm Hg.  An incision was made over the lateral fibula.  Full-thickness flaps were raised subperiosteal elevation was performed.  The fracture was seen and exposed.  Soft tissues were cleared to mobilize the fracture.  Fracture hematoma was retrieved from the fracture site.   Fracture reduction was achieved with a tenaculum.  Given the orientation of the fracture a lag screw could not be placed.  Anatomic reduction was accomplished.  A distal fibula locking plate was placed on the fibula and nonlocking screws were used to secure the proximal portion of the plate to the fibula and unicortical screws were used to secure the distal portion of the plate.  Each screw had excellent fixation.  Stress exam showed no widening of the medial clear space over the syndesmosis interval.  The posterior malleolus fracture was anatomically reduced.  Final x-rays were taken.  Surgical site was thoroughly irrigated and closed in layered fashion using 0 Vicryl, 3-0 Vicryl, 3-0 nylon.  Sterile dressings were applied.  Short leg splint was placed.  Patient tolerated procedure well no immediate complications.  Keith Mcconnell, my PA, was a medical necessity for the entirety of the surgery including opening, closing, limb positioning, retracting, exposing, and repairing.  POSTOPERATIVE PLAN: Keith Mcconnell will remain nonweightbearing on this leg for approximately 4-6 weeks; Keith Mcconnell will return for suture removal in 2 weeks.  He will be immobilized in a short leg splint and then transitioned to a CAM walker at his first follow up appointment.  Keith Mcconnell will receive DVT prophylaxis based on other medications, activity level, and risk ratio of bleeding to thrombosis.  Keith Reel, MD [facility] 3:39 PM

## 2023-06-17 NOTE — Anesthesia Preprocedure Evaluation (Addendum)
 Anesthesia Evaluation    Reviewed: Allergy & Precautions, Patient's Chart, lab work & pertinent test results  History of Anesthesia Complications Negative for: history of anesthetic complications  Airway Mallampati: II  TM Distance: >3 FB Neck ROM: Full    Dental no notable dental hx.    Pulmonary neg pulmonary ROS   Pulmonary exam normal breath sounds clear to auscultation       Cardiovascular Normal cardiovascular exam+ Valvular Problems/Murmurs  Rhythm:Regular Rate:Normal     Neuro/Psych negative neurological ROS  negative psych ROS   GI/Hepatic negative GI ROS,,,(+)     substance abuse  alcohol use  Endo/Other  negative endocrine ROS    Renal/GU negative Renal ROS     Musculoskeletal negative musculoskeletal ROS (+)    Abdominal   Peds  (+) ADHD Hematology negative hematology ROS (+)   Anesthesia Other Findings   Reproductive/Obstetrics                              Anesthesia Physical Anesthesia Plan  ASA: 2  Anesthesia Plan: General   Post-op Pain Management: Tylenol PO (pre-op)* and Regional block*   Induction: Intravenous  PONV Risk Score and Plan: 2 and Treatment may vary due to age or medical condition, Ondansetron, Dexamethasone and Midazolam  Airway Management Planned: LMA  Additional Equipment: None  Intra-op Plan:   Post-operative Plan: Extubation in OR  Informed Consent:   Plan Discussed with: CRNA and Anesthesiologist  Anesthesia Plan Comments:          Anesthesia Quick Evaluation

## 2023-06-17 NOTE — Anesthesia Procedure Notes (Signed)
 Anesthesia Regional Block: Adductor canal block   Pre-Anesthetic Checklist: , timeout performed,  Correct Patient, Correct Site, Correct Laterality,  Correct Procedure, Correct Position, site marked,  Risks and benefits discussed,  Surgical consent,  Pre-op evaluation,  At surgeon's request and post-op pain management  Laterality: Right  Prep: chloraprep       Needles:  Injection technique: Single-shot  Needle Type: Echogenic Needle     Needle Length: 9cm  Needle Gauge: 21     Additional Needles:   Procedures:,,,, ultrasound used (permanent image in chart),,    Narrative:  Start time: 06/17/2023 2:25 PM End time: 06/17/2023 2:30 PM Injection made incrementally with aspirations every 5 mL.  Performed by: Personally  Anesthesiologist: Collene Schlichter, MD  Additional Notes: No pain on injection. No increased resistance to injection. Injection made in 5cc increments.  Good needle visualization.  Patient tolerated procedure well.

## 2023-06-17 NOTE — Discharge Instructions (Signed)
    1. Keep splint clean and dry 2. Elevate foot above level of the heart 3. Take aspirin to prevent blood clots 4. Take pain meds as needed 5. Strict non weight bearing to operative extremity  Per [facility] clinic policy, our goal is ensure optimal postoperative pain control with a multimodal pain management strategy. For all [facility] patients, our goal is to wean post-operative narcotic medications by 6 weeks post-operatively. If this is not possible due to utilization of pain medication prior to surgery, your [facility] doctor will support your acute post-operative pain control for the first 6 weeks postoperatively, with a plan to transition you back to your primary pain team following that. Cyndia Skeeters will work to ensure a Therapist, occupational.

## 2023-06-17 NOTE — H&P (Signed)
    PREOPERATIVE H&P  Chief Complaint: right bimalleolar fracture  HPI: Keith Mcconnell is a 23 y.o. male who presents for surgical treatment of right bimalleolar fracture.  He denies any changes in medical history.  Past Surgical History:  Procedure Laterality Date   TONSILLECTOMY     WISDOM TOOTH EXTRACTION     Social History   Socioeconomic History   Marital status: Single    Spouse name: Not on file   Number of children: Not on file   Years of education: Not on file   Highest education level: Not on file  Occupational History   Not on file  Tobacco Use   Smoking status: Never   Smokeless tobacco: Never  Vaping Use   Vaping status: Every Day   Substances: Nicotine, Flavoring  Substance and Sexual Activity   Alcohol use: Yes    Alcohol/week: 35.0 standard drinks of alcohol    Types: 35 Cans of beer per week    Comment: 5 per night   Drug use: No   Sexual activity: Not Currently  Other Topics Concern   Not on file  Social History Narrative   Not on file   Social Drivers of Health   Financial Resource Strain: Not on file  Food Insecurity: Not on file  Transportation Needs: Not on file  Physical Activity: Not on file  Stress: Not on file  Social Connections: Not on file   Family History  Problem Relation Age of Onset   Healthy Mother    Healthy Father    No Known Allergies Prior to Admission medications   Medication Sig Start Date End Date Taking? Authorizing Provider  Calcium Carb-Cholecalciferol (CALCIUM+D3 PO) Take 1 tablet by mouth daily. 1200 mg / 1000 units   Yes [provider]  oxyCODONE-acetaminophen (PERCOCET/ROXICET) 5-325 MG tablet Take 1 tablet by mouth every 6 (six) hours as needed for severe pain (pain score 7-10). 06/08/23  Yes Ernie Avena, MD  acetaminophen (TYLENOL) 500 MG tablet Take 1 tablet (500 mg total) by mouth every 6 (six) hours as needed. Patient not taking: Reported on 06/16/2023 04/28/23   Fayrene Helper, PA-C  benzonatate  (TESSALON) 100 MG capsule Take 1 capsule (100 mg total) by mouth every 8 (eight) hours. Patient not taking: Reported on 06/16/2023 04/28/23   Fayrene Helper, PA-C     Positive ROS: All other systems have been reviewed and were otherwise negative with the exception of those mentioned in the HPI and as above.  Physical Exam: General: Alert, no acute distress Cardiovascular: No pedal edema Respiratory: No cyanosis, no use of accessory musculature GI: abdomen soft Skin: No lesions in the area of chief complaint Neurologic: Sensation intact distally Psychiatric: Patient is competent for consent with normal mood and affect Lymphatic: no lymphedema  MUSCULOSKELETAL: exam stable  Assessment: right bimalleolar fracture  Plan: Plan for Procedure(s): OPEN REDUCTION INTERNAL FIXATION (ORIF) ANKLE FRACTURE  The risks benefits and alternatives were discussed with the patient including but not limited to the risks of nonoperative treatment, versus surgical intervention including infection, bleeding, nerve injury,  blood clots, cardiopulmonary complications, morbidity, mortality, among others, and they were willing to proceed.   Glee Arvin, MD 06/17/2023 11:08 AM

## 2023-06-17 NOTE — Transfer of Care (Signed)
 Immediate Anesthesia Transfer of Care Note  Patient: Keith Mcconnell  Procedure(s) Performed: OPEN REDUCTION INTERNAL FIXATION (ORIF) ANKLE FRACTURE (Right: Ankle)  Patient Location: PACU  Anesthesia Type:General  Level of Consciousness: sedated  Airway & Oxygen Therapy: Patient Spontanous Breathing and Patient connected to face mask oxygen  Post-op Assessment: Report given to RN and Post -op Vital signs reviewed and stable  Post vital signs: Reviewed and stable  Last Vitals:  Vitals Value Taken Time  BP 89/36 06/17/23 1600  Temp 36.9 C 06/17/23 1558  Pulse 70 06/17/23 1601  Resp 18 06/17/23 1601  SpO2 100 % 06/17/23 1601  Vitals shown include unfiled device data.  Last Pain:  Vitals:   06/17/23 1420  TempSrc:   PainSc: 2          Complications: No notable events documented.

## 2023-06-17 NOTE — Anesthesia Procedure Notes (Signed)
 Procedure Name: LMA Insertion Date/Time: 06/17/2023 2:46 PM  Performed by: Alwyn Ren, CRNAPre-anesthesia Checklist: Timeout performed, Patient identified, Emergency Drugs available, Suction available and Patient being monitored Patient Re-evaluated:Patient Re-evaluated prior to induction Oxygen Delivery Method: Circle system utilized Preoxygenation: Pre-oxygenation with 100% oxygen Induction Type: IV induction LMA: LMA inserted LMA Size: 4.0 Number of attempts: 1

## 2023-06-17 NOTE — Anesthesia Postprocedure Evaluation (Signed)
 Anesthesia Post Note  Patient: Keith Mcconnell  Procedure(s) Performed: OPEN REDUCTION INTERNAL FIXATION (ORIF) ANKLE FRACTURE (Right: Ankle)     Patient location during evaluation: PACU Anesthesia Type: General Level of consciousness: awake and alert Pain management: pain level controlled Vital Signs Assessment: post-procedure vital signs reviewed and stable Respiratory status: spontaneous breathing, nonlabored ventilation, respiratory function stable and patient connected to nasal cannula oxygen Cardiovascular status: blood pressure returned to baseline and stable Postop Assessment: no apparent nausea or vomiting Anesthetic complications: no   No notable events documented.  Last Vitals:  Vitals:   06/17/23 1615 06/17/23 1630  BP: (!) 102/47 (!) 107/51  Pulse: 72 65  Resp: 17 11  Temp:  37.1 C  SpO2: 96% 95%    Last Pain:  Vitals:   06/17/23 1630  TempSrc:   PainSc: 0-No pain                 [place]

## 2023-06-17 NOTE — Anesthesia Procedure Notes (Signed)
 Anesthesia Regional Block: Popliteal block   Pre-Anesthetic Checklist: , timeout performed,  Correct Patient, Correct Site, Correct Laterality,  Correct Procedure, Correct Position, site marked,  Risks and benefits discussed,  Surgical consent,  Pre-op evaluation,  At surgeon's request and post-op pain management  Laterality: Right  Prep: chloraprep       Needles:  Injection technique: Single-shot  Needle Type: Echogenic Needle     Needle Length: 9cm  Needle Gauge: 21     Additional Needles:   Procedures:,,,, ultrasound used (permanent image in chart),,    Narrative:  Start time: 06/17/2023 2:20 PM End time: 06/17/2023 2:25 PM Injection made incrementally with aspirations every 5 mL.  Performed by: Personally  Anesthesiologist: Collene Schlichter, MD  Additional Notes: No pain on injection. No increased resistance to injection. Injection made in 5cc increments.  Good needle visualization.  Patient tolerated procedure well.

## 2023-06-18 ENCOUNTER — Encounter (HOSPITAL_COMMUNITY): Payer: Self-pay | Admitting: Orthopaedic Surgery

## 2023-07-01 ENCOUNTER — Other Ambulatory Visit (INDEPENDENT_AMBULATORY_CARE_PROVIDER_SITE_OTHER): Payer: Self-pay

## 2023-07-01 ENCOUNTER — Ambulatory Visit (INDEPENDENT_AMBULATORY_CARE_PROVIDER_SITE_OTHER): Payer: Self-pay | Admitting: Physician Assistant

## 2023-07-01 ENCOUNTER — Encounter: Payer: Self-pay | Admitting: Physician Assistant

## 2023-07-01 DIAGNOSIS — S82841A Displaced bimalleolar fracture of right lower leg, initial encounter for closed fracture: Secondary | ICD-10-CM

## 2023-07-01 NOTE — Progress Notes (Signed)
   Post-Op Visit Note   Patient: Keith Mcconnell           Date of Birth: 06/30/2000           MRN: [id number] Visit Date: 07/01/2023 PCP: [facility]   Assessment & Plan:  Chief Complaint:  Chief Complaint  Patient presents with   Right Ankle - Follow-up    ORIF right ankle 06/17/2023   Visit Diagnoses:  1. Bimalleolar ankle fracture, right, closed, initial encounter     Plan: Patient is a pleasant 23 year old gentleman who comes in today 2 weeks status post ORIF right ankle fracture 06/17/2023.  He has been doing well.  He is taking Norco for pain.  He has been compliant taking a baby aspirin twice daily for DVT prophylaxis.  He has been compliant nonweightbearing.  Examination of his right ankle reveals a well-healing surgical incision with nylon sutures in place.  No evidence of infection or cellulitis.  Calf is soft nontender.  He is neurovascularly intact distally.  Today, sutures were removed and Steri-Strips applied.  We have provided him with a cam boot for which she will wear and remain nonweightbearing.  Continue with his baby aspirin twice daily for another 4 weeks.  Ice and elevate as needed.  Follow-up in 4 weeks for repeat evaluation and three-view x-rays of the right ankle.  Anticipate starting physical therapy and allowing him to bear weight in his cam boot at that time.  Call with concerns or questions in the meantime.  Follow-Up Instructions: Return in about 4 weeks (around 07/29/2023).   Orders:  Orders Placed This Encounter  Procedures   XR Ankle Complete Right   No orders of the defined types were placed in this encounter.   Imaging: XR Ankle Complete Right Result Date: 07/01/2023 X-rays demonstrate stable alignment of the fracture without hardware complication   PMFS History: Patient Active Problem List   Diagnosis Date Noted   Bimalleolar ankle fracture, right, closed, initial encounter 06/16/2023   Past Medical History:  Diagnosis  Date   ADHD    no meds   COVID-19 2021   Heart murmur    never has caused any problems   Hx of seasonal allergies     Family History  Problem Relation Age of Onset   Healthy Mother    Healthy Father     Past Surgical History:  Procedure Laterality Date   ORIF ANKLE FRACTURE Right 06/17/2023   Procedure: OPEN REDUCTION INTERNAL FIXATION (ORIF) ANKLE FRACTURE;  Surgeon: Tarry Kos, MD;  Location: [facility] OR;  Service: Orthopedics;  Laterality: Right;  ORIF RIGHT BIMALLEOLAR FRACTURE   TONSILLECTOMY     WISDOM TOOTH EXTRACTION     Social History   Occupational History   Not on file  Tobacco Use   Smoking status: Never   Smokeless tobacco: Never  Vaping Use   Vaping status: Every Day   Substances: Nicotine, Flavoring  Substance and Sexual Activity   Alcohol use: Yes    Alcohol/week: 35.0 standard drinks of alcohol    Types: 35 Cans of beer per week    Comment: 5 per night   Drug use: No   Sexual activity: Not Currently

## 2023-07-29 ENCOUNTER — Ambulatory Visit: Payer: Self-pay | Admitting: Physician Assistant

## 2023-07-29 ENCOUNTER — Other Ambulatory Visit (INDEPENDENT_AMBULATORY_CARE_PROVIDER_SITE_OTHER): Payer: Self-pay

## 2023-07-29 DIAGNOSIS — S82841A Displaced bimalleolar fracture of right lower leg, initial encounter for closed fracture: Secondary | ICD-10-CM

## 2023-07-29 NOTE — Progress Notes (Signed)
   Post-Op Visit Note   Patient: Keith Mcconnell           Date of Birth: 2000-06-14           MRN: [id number] Visit Date: 07/29/2023 PCP: [facility]   Assessment & Plan:  Chief Complaint:  Chief Complaint  Patient presents with   Right Ankle - Pain   Visit Diagnoses:  1. Bimalleolar ankle fracture, right, closed, initial encounter     Plan: Patient is a pleasant 23 year old gentleman who comes in today 6 weeks status post ORIF right ankle fracture 06/17/2023.  He has been doing well.  No pain.  He has been compliant nonweightbearing in the cam boot.  Examination of the right ankle reveals a fully healed surgical scar without complication.  He still has swelling to the right foot.  Calf soft nontender.  He is neurovascular intact distally.  This point, we have discussed elevating the leg to help with the swelling.  He can start weightbearing in his cam boot.  Will provide him with an ASO brace to transition to and ready.  I sent in a referral for outpatient physical therapy.  He no longer needs to take aspirin  for DVT prophylaxis.  Follow-up in 6 weeks for repeat evaluation and x-rays of the right ankle.  Call with concerns or questions.  Follow-Up Instructions: Return in about 6 weeks (around 09/09/2023).   Orders:  Orders Placed This Encounter  Procedures   XR Ankle Complete Right   Ambulatory referral to Physical Therapy   No orders of the defined types were placed in this encounter.   Imaging: XR Ankle Complete Right Result Date: 07/29/2023 X-rays demonstrate consolidation of the fracture without hardware complication   PMFS History: Patient Active Problem List   Diagnosis Date Noted   Bimalleolar ankle fracture, right, closed, initial encounter 06/16/2023   Past Medical History:  Diagnosis Date   ADHD    no meds   COVID-19 2021   Heart murmur    never has caused any problems   Hx of seasonal allergies     Family History  Problem Relation  Age of Onset   Healthy Mother    Healthy Father     Past Surgical History:  Procedure Laterality Date   ORIF ANKLE FRACTURE Right 06/17/2023   Procedure: OPEN REDUCTION INTERNAL FIXATION (ORIF) ANKLE FRACTURE;  Surgeon: Wes Hamman, MD;  Location: [facility] OR;  Service: Orthopedics;  Laterality: Right;  ORIF RIGHT BIMALLEOLAR FRACTURE   TONSILLECTOMY     WISDOM TOOTH EXTRACTION     Social History   Occupational History   Not on file  Tobacco Use   Smoking status: Never   Smokeless tobacco: Never  Vaping Use   Vaping status: Every Day   Substances: Nicotine, Flavoring  Substance and Sexual Activity   Alcohol use: Yes    Alcohol/week: 35.0 standard drinks of alcohol    Types: 35 Cans of beer per week    Comment: 5 per night   Drug use: No   Sexual activity: Not Currently

## 2023-08-03 ENCOUNTER — Ambulatory Visit: Payer: Self-pay

## 2023-08-03 ENCOUNTER — Other Ambulatory Visit: Payer: Self-pay

## 2023-08-03 ENCOUNTER — Ambulatory Visit: Payer: Self-pay | Attending: Physician Assistant

## 2023-08-03 DIAGNOSIS — R600 Localized edema: Secondary | ICD-10-CM | POA: Insufficient documentation

## 2023-08-03 DIAGNOSIS — R2689 Other abnormalities of gait and mobility: Secondary | ICD-10-CM | POA: Insufficient documentation

## 2023-08-03 DIAGNOSIS — M6281 Muscle weakness (generalized): Secondary | ICD-10-CM | POA: Insufficient documentation

## 2023-08-03 DIAGNOSIS — M25571 Pain in right ankle and joints of right foot: Secondary | ICD-10-CM | POA: Insufficient documentation

## 2023-08-03 NOTE — Therapy (Signed)
 OUTPATIENT PHYSICAL THERAPY LOWER EXTREMITY EVALUATION   Patient Name: Keith Mcconnell MRN: [id number] DOB:April 03, 2000, 23 y.o., male Today's Date: 08/03/2023  END OF SESSION:  PT End of Session - 08/03/23 1524     Visit Number 1    Number of Visits 17    Date for PT Re-Evaluation 09/28/23    Authorization Type self pay    PT Start Time 1445    PT Stop Time 1520    PT Time Calculation (min) 35 min    Activity Tolerance Patient tolerated treatment well    Behavior During Therapy [facility] for tasks assessed/performed             Past Medical History:  Diagnosis Date   ADHD    no meds   COVID-19 2021   Heart murmur    never has caused any problems   Hx of seasonal allergies    Past Surgical History:  Procedure Laterality Date   ORIF ANKLE FRACTURE Right 06/17/2023   Procedure: OPEN REDUCTION INTERNAL FIXATION (ORIF) ANKLE FRACTURE;  Surgeon: Wes Hamman, MD;  Location: [facility] OR;  Service: Orthopedics;  Laterality: Right;  ORIF RIGHT BIMALLEOLAR FRACTURE   TONSILLECTOMY     WISDOM TOOTH EXTRACTION     Patient Active Problem List   Diagnosis Date Noted   Bimalleolar ankle fracture, right, closed, initial encounter 06/16/2023    PCP: [facility]  REFERRING PROVIDER: Sandie Cross, PA-C  REFERRING DIAG: [phone] (ICD-10-CM) - Bimalleolar ankle fracture, right, closed, initial encounter   THERAPY DIAG:  Pain in right ankle and joints of right foot  Muscle weakness (generalized)  Other abnormalities of gait and mobility  Localized edema  Rationale for Evaluation and Treatment: Rehabilitation  ONSET DATE: 06/08/2023 - DOS 06/17/2023  SUBJECTIVE:   SUBJECTIVE STATEMENT: Pt presents to PT s/p R ankle ORIF post bimalleolar fx on 06/17/2023. Notes decrease in pain over recent weeks but has been nervous to start putting weight through his R LE. Denies N/T in R foot, does feel like it is getting tight.   PERTINENT HISTORY: ORIF R ankle  06/17/2023  PAIN:  Are you having pain?  Yes: NPRS scale: 0/10 Worst: 2/10 Pain location: R lateral ankle Pain description: tight, sore Aggravating factors: standing, sitting Relieving factors: rest, ice  PRECAUTIONS: None  RED FLAGS: None   WEIGHT BEARING RESTRICTIONS: WBAT in CAM boot  FALLS:  Has patient fallen in last 6 months? No  LIVING ENVIRONMENT: Lives with: lives with their family Lives in: House/apartment  OCCUPATION: Not currently working  PLOF: Independent  PATIENT GOALS: get back to PLOF and weight lifting  NEXT MD VISIT: Not currently scheduled  OBJECTIVE:  Note: Objective measures were completed at Evaluation unless otherwise noted.  DIAGNOSTIC FINDINGS: See imaging  PATIENT SURVEYS:   LEFS: 31/80  COGNITION: Overall cognitive status: Within functional limits for tasks assessed     SENSATION: WFL   POSTURE: flexed trunk   PALPATION: TTP to R fibularis longus/brevis   LOWER EXTREMITY ROM:  Active ROM Right eval Left eval  Hip flexion    Hip extension    Hip abduction    Hip adduction    Hip internal rotation    Hip external rotation    Knee flexion    Knee extension    Ankle dorsiflexion Lacking 5 to neutral WNL  Ankle plantarflexion    Ankle inversion    Ankle eversion     (Blank rows = not tested)  LOWER  EXTREMITY MMT:  MMT Right eval Left eval  Hip flexion    Hip extension    Hip abduction    Hip adduction    Hip internal rotation    Hip external rotation    Knee flexion    Knee extension    Ankle dorsiflexion 2+ 5  Ankle plantarflexion 3 5  Ankle inversion 3 5  Ankle eversion 3 5   (Blank rows = not tested)  LOWER EXTREMITY SPECIAL TESTS:  DNT  FUNCTIONAL TESTS:  SLS: R - unable; L - 30 seconds  GAIT: Distance walked: 39ft Assistive device utilized: Crutches Level of assistance: Modified independence Comments: started NWB on R LE - progressed via gait training to step-to gait with R LE in  stance   TREATMENT: [facility] Adult PT Treatment:                                                DATE: 08/03/2023 Therapeutic Exercise: Long sitting calf stretch with strap x 30" R Ankle pumps x 20 R Ankle circles x 10 R Weight shifting x 5 - 5" hold R Gait Training: Working on step-gait with WBAT R LE in // then with axillary crutches x 123ft  PATIENT EDUCATION:  Education details: eval findings, LEFS, HEP, POC Person educated: Patient Education method: Explanation, Demonstration, and Handouts Education comprehension: verbalized understanding and returned demonstration  HOME EXERCISE PROGRAM: Access Code: [email or web address] URL: [email or web address] Date: 08/03/2023 Prepared by: Loral Roch  Exercises - Long Sitting Calf Stretch with Strap  - 2-3 x daily - 7 x weekly - 3 reps - 30 sec hold - Supine Ankle Pumps  - 2-3 x daily - 7 x weekly - 3 sets - 20 reps - Supine Ankle Circles  - 2-3 x daily - 7 x weekly - 3 sets - 20 reps - Side to Side Weight Shift with Counter Support  - 2-3 x daily - 7 x weekly - 2 sets - 10 reps - 5 sec hold  ASSESSMENT:  CLINICAL IMPRESSION: Patient is a 23 y.o. M who was seen today for physical therapy evaluation and treatment s/p R ankle ORIF after bimalleolar fx on 06/16/2023. Physical findings are consistent with injury and recovery timeline as pt demonstrates decrease in R ankle strength, ROM, stability and gait. LEFS score shows severe disability in performance of home ADLs and higher level community activities. Pt would benefit from skilled PT services post op in order to get back to PLOF.    OBJECTIVE IMPAIRMENTS: Abnormal gait, decreased activity tolerance, decreased balance, decreased mobility, difficulty walking, decreased ROM, decreased strength, and pain  ACTIVITY LIMITATIONS: carrying, lifting, sitting, standing, squatting, stairs, transfers, and locomotion level  PARTICIPATION LIMITATIONS: meal prep, cleaning, driving, shopping, community  activity, occupation, and yard work  PERSONAL FACTORS: Time since onset of injury/illness/exacerbation are also affecting patient's functional outcome.   REHAB POTENTIAL: Excellent  CLINICAL DECISION MAKING: Stable/uncomplicated  EVALUATION COMPLEXITY: Low   GOALS: Goals reviewed with patient? No  SHORT TERM GOALS: Target date: 08/24/2023   Pt will be compliant and knowledgeable with initial HEP for improved comfort and carryover Baseline: initial HEP given  Goal status: INITIAL  2.  Pt will self report right ankle pain no greater than 1/10 for improved comfort and functional ability Baseline: 2/10 at worst Goal status: INITIAL   LONG TERM GOALS: Target date: 09/28/2023  Pt will improve LEFS to no less than 55/80 as proxy for functional improvement with home ADLs and higher level community activity Baseline: 31/80 Goal status: INITIAL   2.  Pt will self report right ankle pain no greater than 0/10 for improved comfort and functional ability Baseline: 2/10 at worst Goal status: INITIAL   3.  Pt will improve R ankle DF AROM to no less than 10 degrees for improved functional mobility and decrease pain Baseline: lacking 5 degrees Goal status: INITIAL  4.  Pt will improve R SLS hold time to no less than 30 seconds for improved ankle stability and getting back to PLOF Baseline: unable Goal status: INITIAL  5.  Pt will improve R ankle MMT to no less than 4/5 for all tested motions for improved functional ability and decrease pain Baseline: see MMT chart Goal status: INITIAL    PLAN:  PT FREQUENCY: 2x/week  PT DURATION: 8 weeks  PLANNED INTERVENTIONS: 97164- PT Re-evaluation, 97110-Therapeutic exercises, 97530- Therapeutic activity, [id number]- Neuromuscular re-education, 97535- Self Care, [id number]- Manual therapy, [id number]- Gait training, M8413- Electrical stimulation (unattended), [id number]- Electrical stimulation (manual), 97016- Vasopneumatic device, Dry Needling, Cryotherapy, and  Moist heat  PLAN FOR NEXT SESSION: assess HEP resposne, gait training, ankle strengthening, progress boot>ASO brace   Ivor Mars, PT 08/03/2023, 3:25 PM

## 2023-08-06 ENCOUNTER — Ambulatory Visit: Payer: Self-pay

## 2023-08-06 DIAGNOSIS — M25571 Pain in right ankle and joints of right foot: Secondary | ICD-10-CM

## 2023-08-06 DIAGNOSIS — R600 Localized edema: Secondary | ICD-10-CM

## 2023-08-06 DIAGNOSIS — R2689 Other abnormalities of gait and mobility: Secondary | ICD-10-CM

## 2023-08-06 DIAGNOSIS — M6281 Muscle weakness (generalized): Secondary | ICD-10-CM

## 2023-08-06 NOTE — Therapy (Signed)
 OUTPATIENT PHYSICAL THERAPY TREATMENT   Patient Name: Keith Mcconnell MRN: [id number] DOB:10-02-2000, 23 y.o., male Today's Date: 08/06/2023  END OF SESSION:  PT End of Session - 08/06/23 1056     Visit Number 2    Number of Visits 17    Date for PT Re-Evaluation 09/28/23    Authorization Type self pay    PT Start Time 1057    PT Stop Time 1127    PT Time Calculation (min) 30 min    Activity Tolerance Patient tolerated treatment well    Behavior During Therapy [facility] for tasks assessed/performed             Past Medical History:  Diagnosis Date   ADHD    no meds   COVID-19 2021   Heart murmur    never has caused any problems   Hx of seasonal allergies    Past Surgical History:  Procedure Laterality Date   ORIF ANKLE FRACTURE Right 06/17/2023   Procedure: OPEN REDUCTION INTERNAL FIXATION (ORIF) ANKLE FRACTURE;  Surgeon: Wes Hamman, MD;  Location: [facility] OR;  Service: Orthopedics;  Laterality: Right;  ORIF RIGHT BIMALLEOLAR FRACTURE   TONSILLECTOMY     WISDOM TOOTH EXTRACTION     Patient Active Problem List   Diagnosis Date Noted   Bimalleolar ankle fracture, right, closed, initial encounter 06/16/2023    PCP: [facility]  REFERRING PROVIDER: Sandie Cross, PA-C  REFERRING DIAG: [id number] (ICD-10-CM) - Bimalleolar ankle fracture, right, closed, initial encounter   THERAPY DIAG:  Pain in right ankle and joints of right foot  Muscle weakness (generalized)  Other abnormalities of gait and mobility  Localized edema  Rationale for Evaluation and Treatment: Rehabilitation  ONSET DATE: 06/08/2023 - DOS 06/17/2023  SUBJECTIVE:   SUBJECTIVE STATEMENT: Pt presents to PT with no current reports of pain. Has been compliant with HEP and notes it is feeling better.   EVAL: Pt presents to PT s/p R ankle ORIF post bimalleolar fx on 06/17/2023. Notes decrease in pain over recent weeks but has been nervous to start putting weight through his R LE.  Denies N/T in R foot, does feel like it is getting tight.   PERTINENT HISTORY: ORIF R ankle 06/17/2023  PAIN:  Are you having pain?  Yes: NPRS scale: 0/10 Worst: 2/10 Pain location: R lateral ankle Pain description: tight, sore Aggravating factors: standing, sitting Relieving factors: rest, ice  PRECAUTIONS: None  RED FLAGS: None   WEIGHT BEARING RESTRICTIONS: WBAT in CAM boot  FALLS:  Has patient fallen in last 6 months? No  LIVING ENVIRONMENT: Lives with: lives with their family Lives in: House/apartment  OCCUPATION: Not currently working  PLOF: Independent  PATIENT GOALS: get back to PLOF and weight lifting  NEXT MD VISIT: Not currently scheduled  OBJECTIVE:  Note: Objective measures were completed at Evaluation unless otherwise noted.  DIAGNOSTIC FINDINGS: See imaging  PATIENT SURVEYS:   LEFS: 31/80  COGNITION: Overall cognitive status: Within functional limits for tasks assessed     SENSATION: WFL   POSTURE: flexed trunk   PALPATION: TTP to R fibularis longus/brevis   LOWER EXTREMITY ROM:  Active ROM Right eval Left eval  Hip flexion    Hip extension    Hip abduction    Hip adduction    Hip internal rotation    Hip external rotation    Knee flexion    Knee extension    Ankle dorsiflexion Lacking 5 to neutral WNL  Ankle  plantarflexion    Ankle inversion    Ankle eversion     (Blank rows = not tested)  LOWER EXTREMITY MMT:  MMT Right eval Left eval  Hip flexion    Hip extension    Hip abduction    Hip adduction    Hip internal rotation    Hip external rotation    Knee flexion    Knee extension    Ankle dorsiflexion 2+ 5  Ankle plantarflexion 3 5  Ankle inversion 3 5  Ankle eversion 3 5   (Blank rows = not tested)  LOWER EXTREMITY SPECIAL TESTS:  DNT  FUNCTIONAL TESTS:  SLS: R - unable; L - 30 seconds  GAIT: Distance walked: 70ft Assistive device utilized: Crutches Level of assistance: Modified  independence Comments: started NWB on R LE - progressed via gait training to step-to gait with R LE in stance   TREATMENT: [facility] Adult PT Treatment:                                                DATE: 08/06/2023 Therapeutic Exercise: NuStep lvl 5 LE only x 4 min for functional activity tolerance Long sitting calf stretch with strap 2x45" R R ankle DF/inv/ev/PF 2x10 YTB Seated R ankle heel-toe raises 3x15 R ankle ABC R ankle pumps x 20 R ankle circles x 20 cw/ccw Gait Training: Working on step-gait with WBAT R LE in // then x 216ft  Step-to gait with R stance in // - L UE support x 3 laps Modalities: GameReady: Right Ankle 10 min; 38 degrees Low compression Supine with R LE elevated  PATIENT EDUCATION:  Education details: continue HEP Person educated: Patient Education method: Explanation, Demonstration, and Handouts Education comprehension: verbalized understanding and returned demonstration  HOME EXERCISE PROGRAM: Access Code: [email or web address] URL: [email or web address] Date: 08/06/2023 Prepared by: Loral Roch  Exercises - Long Sitting Calf Stretch with Strap  - 2-3 x daily - 7 x weekly - 3 reps - 30 sec hold - Supine Ankle Pumps  - 2-3 x daily - 7 x weekly - 3 sets - 20 reps - Supine Ankle Circles  - 2-3 x daily - 7 x weekly - 3 sets - 20 reps - Side to Side Weight Shift with Counter Support  - 2-3 x daily - 7 x weekly - 2 sets - 10 reps - 5 sec hold - Ankle Dorsiflexion with Resistance  - 1 x daily - 7 x weekly - 2 sets - 10 reps - yellow hold - Ankle Inversion with Resistance  - 1 x daily - 7 x weekly - 2 sets - 10 reps - yellow hold - Ankle Eversion with Resistance  - 1 x daily - 7 x weekly - 2 sets - 10 reps - yellow hold - Ankle and Toe Plantarflexion with Resistance  - 1 x daily - 7 x weekly - 2 sets - 10 reps - yellow hold - Seated Heel Toe Raises  - 1 x daily - 7 x weekly - 2-3 sets - 15 reps  ASSESSMENT:  CLINICAL IMPRESSION: Pt was able to complete  all prescribed exercises with no adverse effect. Therapy today work on progression of WB for R LE as well as improving R ankle motor control, strength, and ROM. HEP updated for continued progression of R ankle exercises. He continues to benefit from skilled PT services, will  continue to progress as able and wean from boot as tolerated.   EVAL: Patient is a 23 y.o. M who was seen today for physical therapy evaluation and treatment s/p R ankle ORIF after bimalleolar fx on 06/16/2023. Physical findings are consistent with injury and recovery timeline as pt demonstrates decrease in R ankle strength, ROM, stability and gait. LEFS score shows severe disability in performance of home ADLs and higher level community activities. Pt would benefit from skilled PT services post op in order to get back to PLOF.    OBJECTIVE IMPAIRMENTS: Abnormal gait, decreased activity tolerance, decreased balance, decreased mobility, difficulty walking, decreased ROM, decreased strength, and pain  ACTIVITY LIMITATIONS: carrying, lifting, sitting, standing, squatting, stairs, transfers, and locomotion level  PARTICIPATION LIMITATIONS: meal prep, cleaning, driving, shopping, community activity, occupation, and yard work  PERSONAL FACTORS: Time since onset of injury/illness/exacerbation are also affecting patient's functional outcome.   REHAB POTENTIAL: Excellent  CLINICAL DECISION MAKING: Stable/uncomplicated  EVALUATION COMPLEXITY: Low   GOALS: Goals reviewed with patient? No  SHORT TERM GOALS: Target date: 08/24/2023   Pt will be compliant and knowledgeable with initial HEP for improved comfort and carryover Baseline: initial HEP given  Goal status: INITIAL  2.  Pt will self report right ankle pain no greater than 1/10 for improved comfort and functional ability Baseline: 2/10 at worst Goal status: INITIAL   LONG TERM GOALS: Target date: 09/28/2023  Pt will improve LEFS to no less than 55/80 as proxy for  functional improvement with home ADLs and higher level community activity Baseline: 31/80 Goal status: INITIAL   2.  Pt will self report right ankle pain no greater than 0/10 for improved comfort and functional ability Baseline: 2/10 at worst Goal status: INITIAL   3.  Pt will improve R ankle DF AROM to no less than 10 degrees for improved functional mobility and decrease pain Baseline: lacking 5 degrees Goal status: INITIAL  4.  Pt will improve R SLS hold time to no less than 30 seconds for improved ankle stability and getting back to PLOF Baseline: unable Goal status: INITIAL  5.  Pt will improve R ankle MMT to no less than 4/5 for all tested motions for improved functional ability and decrease pain Baseline: see MMT chart Goal status: INITIAL    PLAN:  PT FREQUENCY: 2x/week  PT DURATION: 8 weeks  PLANNED INTERVENTIONS: 97164- PT Re-evaluation, 97110-Therapeutic exercises, 97530- Therapeutic activity, [id number]- Neuromuscular re-education, 97535- Self Care, 16109- Manual therapy, [id number]- Gait training, U0454- Electrical stimulation (unattended), [id number]- Electrical stimulation (manual), 97016- Vasopneumatic device, Dry Needling, Cryotherapy, and Moist heat  PLAN FOR NEXT SESSION: assess HEP resposne, gait training, ankle strengthening, progress boot>ASO brace   Ivor Mars, PT 08/06/2023, 11:36 AM

## 2023-08-09 ENCOUNTER — Ambulatory Visit: Payer: Self-pay | Admitting: Physical Therapy

## 2023-08-09 ENCOUNTER — Encounter: Payer: Self-pay | Admitting: Physical Therapy

## 2023-08-09 DIAGNOSIS — M25571 Pain in right ankle and joints of right foot: Secondary | ICD-10-CM

## 2023-08-09 DIAGNOSIS — M6281 Muscle weakness (generalized): Secondary | ICD-10-CM

## 2023-08-09 DIAGNOSIS — R2689 Other abnormalities of gait and mobility: Secondary | ICD-10-CM

## 2023-08-09 DIAGNOSIS — R600 Localized edema: Secondary | ICD-10-CM

## 2023-08-09 NOTE — Therapy (Signed)
 OUTPATIENT PHYSICAL THERAPY TREATMENT   Patient Name: Keith Mcconnell MRN: [id number] DOB:[birth year], 23 y.o., male Today's Date: 08/09/2023  END OF SESSION:  PT End of Session - 08/09/23 1642     Visit Number 3    Number of Visits 17    Date for PT Re-Evaluation 09/28/23    PT Start Time 1450    PT Stop Time 1530    PT Time Calculation (min) 40 min    Activity Tolerance Patient tolerated treatment well    Behavior During Therapy [facility] for tasks assessed/performed              Past Medical History:  Diagnosis Date   ADHD    no meds   COVID-19 2021   Heart murmur    never has caused any problems   Hx of seasonal allergies    Past Surgical History:  Procedure Laterality Date   ORIF ANKLE FRACTURE Right 06/17/2023   Procedure: OPEN REDUCTION INTERNAL FIXATION (ORIF) ANKLE FRACTURE;  Surgeon: Wes Hamman, MD;  Location: [facility] OR;  Service: Orthopedics;  Laterality: Right;  ORIF RIGHT BIMALLEOLAR FRACTURE   TONSILLECTOMY     WISDOM TOOTH EXTRACTION     Patient Active Problem List   Diagnosis Date Noted   Bimalleolar ankle fracture, right, closed, initial encounter 06/16/2023    PCP: [facility]  REFERRING PROVIDER: Sandie Cross, PA-C  REFERRING DIAG: [phone] (ICD-10-CM) - Bimalleolar ankle fracture, right, closed, initial encounter   THERAPY DIAG:  Pain in right ankle and joints of right foot  Other abnormalities of gait and mobility  Muscle weakness (generalized)  Localized edema  Rationale for Evaluation and Treatment: Rehabilitation  ONSET DATE: 06/08/2023 - DOS 06/17/2023  SUBJECTIVE:   SUBJECTIVE STATEMENT: Pt attended today's session with reports of 0/10 pain. Pt stated that they have maintained good compliance with current HEP.  Stated that his surgeon said he can start putting more weight through RLE w/ CAM boot   EVAL: Pt presents to PT s/p R ankle ORIF post bimalleolar fx on 06/17/2023. Notes decrease in pain over  recent weeks but has been nervous to start putting weight through his R LE. Denies N/T in R foot, does feel like it is getting tight.   PERTINENT HISTORY: ORIF R ankle 06/17/2023  PAIN:  Are you having pain?  Yes: NPRS scale: 0/10 Worst: 2/10 Pain location: R lateral ankle Pain description: tight, sore Aggravating factors: standing, sitting Relieving factors: rest, ice  PRECAUTIONS: None  RED FLAGS: None   WEIGHT BEARING RESTRICTIONS: WBAT in CAM boot  FALLS:  Has patient fallen in last 6 months? No  LIVING ENVIRONMENT: Lives with: lives with their family Lives in: House/apartment  OCCUPATION: Not currently working  PLOF: Independent  PATIENT GOALS: get back to PLOF and weight lifting  NEXT MD VISIT: Not currently scheduled  OBJECTIVE:  Note: Objective measures were completed at Evaluation unless otherwise noted.  DIAGNOSTIC FINDINGS: See imaging  PATIENT SURVEYS:   LEFS: 31/80  COGNITION: Overall cognitive status: Within functional limits for tasks assessed     SENSATION: WFL   POSTURE: flexed trunk   PALPATION: TTP to R fibularis longus/brevis   LOWER EXTREMITY ROM:  Active ROM Right eval Left eval  Hip flexion    Hip extension    Hip abduction    Hip adduction    Hip internal rotation    Hip external rotation    Knee flexion    Knee extension  Ankle dorsiflexion Lacking 5 to neutral WNL  Ankle plantarflexion    Ankle inversion    Ankle eversion     (Blank rows = not tested)  LOWER EXTREMITY MMT:  MMT Right eval Left eval  Hip flexion    Hip extension    Hip abduction    Hip adduction    Hip internal rotation    Hip external rotation    Knee flexion    Knee extension    Ankle dorsiflexion 2+ 5  Ankle plantarflexion 3 5  Ankle inversion 3 5  Ankle eversion 3 5   (Blank rows = not tested)  LOWER EXTREMITY SPECIAL TESTS:  DNT  FUNCTIONAL TESTS:  SLS: R - unable; L - 30 seconds  GAIT: Distance walked: 78ft Assistive  device utilized: Crutches Level of assistance: Modified independence Comments: started NWB on R LE - progressed via gait training to step-to gait with R LE in stance   TREATMENT: [facility] Adult PT Treatment:                                                DATE: 08/09/2023 Therapeutic Activity: NuStep 5' Standing weight shifts in CAM boot 2x10, 5s hold, UE assist PRN (got to a SLS on RLE) STS with equal WB cue in CAM boot 2X12,  Seated gastroc stretch w/strap 2x1' Seated soleus stretch 2x1'   [facility] Adult PT Treatment:                                                DATE: 08/06/2023 Therapeutic Exercise: NuStep lvl 5 LE only x 4 min for functional activity tolerance Long sitting calf stretch with strap 2x45" R R ankle DF/inv/ev/PF 2x10 YTB Seated R ankle heel-toe raises 3x15 R ankle ABC R ankle pumps x 20 R ankle circles x 20 cw/ccw Gait Training: Working on step-gait with WBAT R LE in // then x 277ft  Step-to gait with R stance in // - L UE support x 3 laps Modalities: GameReady: Right Ankle 10 min; 38 degrees Low compression Supine with R LE elevated  PATIENT EDUCATION:  Education details: continue HEP Person educated: Patient Education method: Explanation, Demonstration, and Handouts Education comprehension: verbalized understanding and returned demonstration  HOME EXERCISE PROGRAM: Access Code: [email or web address] URL: [email or web address] Date: 08/06/2023 Prepared by: Loral Roch  Exercises - Long Sitting Calf Stretch with Strap  - 2-3 x daily - 7 x weekly - 3 reps - 30 sec hold - Supine Ankle Pumps  - 2-3 x daily - 7 x weekly - 3 sets - 20 reps - Supine Ankle Circles  - 2-3 x daily - 7 x weekly - 3 sets - 20 reps - Side to Side Weight Shift with Counter Support  - 2-3 x daily - 7 x weekly - 2 sets - 10 reps - 5 sec hold - Ankle Dorsiflexion with Resistance  - 1 x daily - 7 x weekly - 2 sets - 10 reps - yellow hold - Ankle Inversion with Resistance  - 1 x daily -  7 x weekly - 2 sets - 10 reps - yellow hold - Ankle Eversion with Resistance  - 1 x daily - 7 x weekly - 2 sets - 10  reps - yellow hold - Ankle and Toe Plantarflexion with Resistance  - 1 x daily - 7 x weekly - 2 sets - 10 reps - yellow hold - Seated Heel Toe Raises  - 1 x daily - 7 x weekly - 2-3 sets - 15 reps  ASSESSMENT:  CLINICAL IMPRESSION: Pt attended physical therapy session for continuation of treatment regarding R ankle ORIF. Today's treatment focused on improvement of WB tolerance in CAM boot as well as general ankle stability/strength/mobility. Pt showed  great tolerance to administered treatment with no adverse effects by the end of session. Skilled intervention was utilized via activity modification for pt tolerance with task completion, functional progression/regression for best outcomes inline with current rehab goals, as well as minimal verbal/tactile cuing alongside no physical assistance for safe and appropriate performance for today's activities. Continue with therapeutic focus on WB tolerance in CAM boot, begin to transition away from AD, and eventually to ASO as tolerated within surgical protocol.   EVAL: Patient is a 23 y.o. M who was seen today for physical therapy evaluation and treatment s/p R ankle ORIF after bimalleolar fx on 06/16/2023. Physical findings are consistent with injury and recovery timeline as pt demonstrates decrease in R ankle strength, ROM, stability and gait. LEFS score shows severe disability in performance of home ADLs and higher level community activities. Pt would benefit from skilled PT services post op in order to get back to PLOF.    OBJECTIVE IMPAIRMENTS: Abnormal gait, decreased activity tolerance, decreased balance, decreased mobility, difficulty walking, decreased ROM, decreased strength, and pain  ACTIVITY LIMITATIONS: carrying, lifting, sitting, standing, squatting, stairs, transfers, and locomotion level  PARTICIPATION LIMITATIONS: meal prep,  cleaning, driving, shopping, community activity, occupation, and yard work  PERSONAL FACTORS: Time since onset of injury/illness/exacerbation are also affecting patient's functional outcome.   REHAB POTENTIAL: Excellent  CLINICAL DECISION MAKING: Stable/uncomplicated  EVALUATION COMPLEXITY: Low   GOALS: Goals reviewed with patient? No  SHORT TERM GOALS: Target date: 08/24/2023   Pt will be compliant and knowledgeable with initial HEP for improved comfort and carryover Baseline: initial HEP given  Goal status: INITIAL  2.  Pt will self report right ankle pain no greater than 1/10 for improved comfort and functional ability Baseline: 2/10 at worst Goal status: INITIAL   LONG TERM GOALS: Target date: 09/28/2023  Pt will improve LEFS to no less than 55/80 as proxy for functional improvement with home ADLs and higher level community activity Baseline: 31/80 Goal status: INITIAL   2.  Pt will self report right ankle pain no greater than 0/10 for improved comfort and functional ability Baseline: 2/10 at worst Goal status: INITIAL   3.  Pt will improve R ankle DF AROM to no less than 10 degrees for improved functional mobility and decrease pain Baseline: lacking 5 degrees Goal status: INITIAL  4.  Pt will improve R SLS hold time to no less than 30 seconds for improved ankle stability and getting back to PLOF Baseline: unable Goal status: INITIAL  5.  Pt will improve R ankle MMT to no less than 4/5 for all tested motions for improved functional ability and decrease pain Baseline: see MMT chart Goal status: INITIAL    PLAN:  PT FREQUENCY: 2x/week  PT DURATION: 8 weeks  PLANNED INTERVENTIONS: 97164- PT Re-evaluation, 97110-Therapeutic exercises, 97530- Therapeutic activity, [id number]- Neuromuscular re-education, 97535- Self Care, 96045- Manual therapy, [id number]- Gait training, [id number]- Electrical stimulation (unattended), [id number]- Electrical stimulation (manual), 97016- Vasopneumatic  device, Dry Needling, Cryotherapy, and  Moist heat  PLAN FOR NEXT SESSION: assess HEP resposne, gait training, ankle strengthening, progress boot>ASO brace   Bunny Caroli, PT 08/09/2023, 4:43 PM

## 2023-08-12 ENCOUNTER — Ambulatory Visit: Payer: Self-pay

## 2023-08-12 ENCOUNTER — Telehealth: Payer: Self-pay

## 2023-08-12 NOTE — Telephone Encounter (Signed)
 Unable to LVM regarding missed appt. - MJ

## 2023-08-17 ENCOUNTER — Ambulatory Visit: Payer: Self-pay

## 2023-08-17 DIAGNOSIS — R600 Localized edema: Secondary | ICD-10-CM

## 2023-08-17 DIAGNOSIS — R2689 Other abnormalities of gait and mobility: Secondary | ICD-10-CM

## 2023-08-17 DIAGNOSIS — M25571 Pain in right ankle and joints of right foot: Secondary | ICD-10-CM

## 2023-08-17 DIAGNOSIS — M6281 Muscle weakness (generalized): Secondary | ICD-10-CM

## 2023-08-17 NOTE — Therapy (Signed)
 OUTPATIENT PHYSICAL THERAPY TREATMENT   Patient Name: Keith Mcconnell MRN: [id number] DOB:Aug 06, 2000, 23 y.o., male Today's Date: 08/17/2023  END OF SESSION:    PT End of Session - 08/17/23 1417     Visit Number 4    Number of Visits 17    Date for PT Re-Evaluation 09/28/23    Authorization Type self pay    PT Start Time 1410    PT Stop Time 1440    PT Time Calculation (min) 30 min    Activity Tolerance Patient tolerated treatment well    Behavior During Therapy [facility] for tasks assessed/performed              Past Medical History:  Diagnosis Date   ADHD    no meds   COVID-19 2021   Heart murmur    never has caused any problems   Hx of seasonal allergies    Past Surgical History:  Procedure Laterality Date   ORIF ANKLE FRACTURE Right 06/17/2023   Procedure: OPEN REDUCTION INTERNAL FIXATION (ORIF) ANKLE FRACTURE;  Surgeon: Wes Hamman, MD;  Location: [facility] OR;  Service: Orthopedics;  Laterality: Right;  ORIF RIGHT BIMALLEOLAR FRACTURE   TONSILLECTOMY     WISDOM TOOTH EXTRACTION     Patient Active Problem List   Diagnosis Date Noted   Bimalleolar ankle fracture, right, closed, initial encounter 06/16/2023    PCP: [facility]  REFERRING PROVIDER: Sandie Cross, PA-C  REFERRING DIAG: [phone] (ICD-10-CM) - Bimalleolar ankle fracture, right, closed, initial encounter   THERAPY DIAG:  Pain in right ankle and joints of right foot  Other abnormalities of gait and mobility  Muscle weakness (generalized)  Localized edema  Rationale for Evaluation and Treatment: Rehabilitation  ONSET DATE: 06/08/2023 - DOS 06/17/2023  SUBJECTIVE:   SUBJECTIVE STATEMENT:  Patient reports that he continues to work on [employer] and feels better than last treatment session. He will follow up with MD on 09/09/2023.    EVAL: Pt presents to PT s/p R ankle ORIF post bimalleolar fx on 06/17/2023. Notes decrease in pain over recent weeks but has been nervous to start  putting weight through his R LE. Denies N/T in R foot, does feel like it is getting tight.   PERTINENT HISTORY: ORIF R ankle 06/17/2023  PAIN:  Are you having pain?  Yes: NPRS scale: 0/10 Worst: 2/10 Pain location: R lateral ankle Pain description: tight, sore Aggravating factors: standing, sitting Relieving factors: rest, ice  PRECAUTIONS: None  RED FLAGS: None   WEIGHT BEARING RESTRICTIONS: WBAT in CAM boot  FALLS:  Has patient fallen in last 6 months? No  LIVING ENVIRONMENT: Lives with: lives with their family Lives in: House/apartment  OCCUPATION: Not currently working  PLOF: Independent  PATIENT GOALS: get back to PLOF and weight lifting  NEXT MD VISIT: Not currently scheduled  OBJECTIVE:  Note: Objective measures were completed at Evaluation unless otherwise noted.  DIAGNOSTIC FINDINGS: See imaging  PATIENT SURVEYS:   LEFS: 31/80  COGNITION: Overall cognitive status: Within functional limits for tasks assessed     SENSATION: WFL   POSTURE: flexed trunk   PALPATION: TTP to R fibularis longus/brevis   LOWER EXTREMITY ROM:  Active ROM Right eval Left eval  Hip flexion    Hip extension    Hip abduction    Hip adduction    Hip internal rotation    Hip external rotation    Knee flexion    Knee extension    Ankle  dorsiflexion Lacking 5 to neutral WNL  Ankle plantarflexion    Ankle inversion    Ankle eversion     (Blank rows = not tested)  LOWER EXTREMITY MMT:  MMT Right eval Left eval  Hip flexion    Hip extension    Hip abduction    Hip adduction    Hip internal rotation    Hip external rotation    Knee flexion    Knee extension    Ankle dorsiflexion 2+ 5  Ankle plantarflexion 3 5  Ankle inversion 3 5  Ankle eversion 3 5   (Blank rows = not tested)  LOWER EXTREMITY SPECIAL TESTS:  DNT  FUNCTIONAL TESTS:  SLS: R - unable; L - 30 seconds  GAIT: Distance walked: 28ft Assistive device utilized: Crutches Level of  assistance: Modified independence Comments: started NWB on R LE - progressed via gait training to step-to gait with R LE in stance   TREATMENT:  [facility] Adult PT Treatment:                                                DATE: 08/17/2023  Therapeutic Activity:  NuStep 5', level 4  Weight Bearing with Parallel Bars  Ambulation with WB in CAM boot donned Step ups to Airex 2 x 10 fwd Theraband pad 2 x 10 lateral STS with equal WB without CAM boot 2 x 5  Seated heel and toe raises  Patient education regarding importance of weight bearing with boot focus in order to wean from boot and transition to ASO brace.   [facility] Adult PT Treatment:                                                DATE: 08/09/2023 Therapeutic Activity: NuStep 5' Standing weight shifts in CAM boot 2x10, 5s hold, UE assist PRN (got to a SLS on RLE) STS with equal WB cue in CAM boot 2X12,  Seated gastroc stretch w/strap 2x1' Seated soleus stretch 2x1'   [facility] Adult PT Treatment:                                                DATE: 08/06/2023 Therapeutic Exercise: NuStep lvl 5 LE only x 4 min for functional activity tolerance Long sitting calf stretch with strap 2x45" R R ankle DF/inv/ev/PF 2x10 YTB Seated R ankle heel-toe raises 3x15 R ankle ABC R ankle pumps x 20 R ankle circles x 20 cw/ccw Gait Training: Working on step-gait with WBAT R LE in // then x 223ft  Step-to gait with R stance in // - L UE support x 3 laps Modalities: GameReady: Right Ankle 10 min; 38 degrees Low compression Supine with R LE elevated  PATIENT EDUCATION:  Education details: continue HEP Person educated: Patient Education method: Explanation, Demonstration, and Handouts Education comprehension: verbalized understanding and returned demonstration  HOME EXERCISE PROGRAM: Access Code: [email or web address] URL: [email or web address] Date: 08/06/2023 Prepared by: Loral Roch  Exercises - Long Sitting Calf Stretch with Strap  -  2-3 x daily - 7 x weekly - 3 reps - 30 sec hold - Supine  Ankle Pumps  - 2-3 x daily - 7 x weekly - 3 sets - 20 reps - Supine Ankle Circles  - 2-3 x daily - 7 x weekly - 3 sets - 20 reps - Side to Side Weight Shift with Counter Support  - 2-3 x daily - 7 x weekly - 2 sets - 10 reps - 5 sec hold - Ankle Dorsiflexion with Resistance  - 1 x daily - 7 x weekly - 2 sets - 10 reps - yellow hold - Ankle Inversion with Resistance  - 1 x daily - 7 x weekly - 2 sets - 10 reps - yellow hold - Ankle Eversion with Resistance  - 1 x daily - 7 x weekly - 2 sets - 10 reps - yellow hold - Ankle and Toe Plantarflexion with Resistance  - 1 x daily - 7 x weekly - 2 sets - 10 reps - yellow hold - Seated Heel Toe Raises  - 1 x daily - 7 x weekly - 2-3 sets - 15 reps  ASSESSMENT:  CLINICAL IMPRESSION: Keith Mcconnell had fair tolerance of today's treatment session, which focused on progression of weight bearing activities with boot donned. Patient had some difficulty with prolonged WB activities, and requires ongoing HEP and interventions to improve this. We will continue to progress per POC as tolerated, in order to reach established rehab goals.       EVAL: Patient is a 23 y.o. M who was seen today for physical therapy evaluation and treatment s/p R ankle ORIF after bimalleolar fx on 06/16/2023. Physical findings are consistent with injury and recovery timeline as pt demonstrates decrease in R ankle strength, ROM, stability and gait. LEFS score shows severe disability in performance of home ADLs and higher level community activities. Pt would benefit from skilled PT services post op in order to get back to PLOF.    OBJECTIVE IMPAIRMENTS: Abnormal gait, decreased activity tolerance, decreased balance, decreased mobility, difficulty walking, decreased ROM, decreased strength, and pain  ACTIVITY LIMITATIONS: carrying, lifting, sitting, standing, squatting, stairs, transfers, and locomotion level  PARTICIPATION LIMITATIONS:  meal prep, cleaning, driving, shopping, community activity, occupation, and yard work  PERSONAL FACTORS: Time since onset of injury/illness/exacerbation are also affecting patient's functional outcome.   REHAB POTENTIAL: Excellent  CLINICAL DECISION MAKING: Stable/uncomplicated  EVALUATION COMPLEXITY: Low   GOALS: Goals reviewed with patient? No  SHORT TERM GOALS: Target date: 08/24/2023   Pt will be compliant and knowledgeable with initial HEP for improved comfort and carryover Baseline: initial HEP given  Goal status: INITIAL  2.  Pt will self report right ankle pain no greater than 1/10 for improved comfort and functional ability Baseline: 2/10 at worst Goal status: INITIAL   LONG TERM GOALS: Target date: 09/28/2023  Pt will improve LEFS to no less than 55/80 as proxy for functional improvement with home ADLs and higher level community activity Baseline: 31/80 Goal status: INITIAL   2.  Pt will self report right ankle pain no greater than 0/10 for improved comfort and functional ability Baseline: 2/10 at worst Goal status: INITIAL   3.  Pt will improve R ankle DF AROM to no less than 10 degrees for improved functional mobility and decrease pain Baseline: lacking 5 degrees Goal status: INITIAL  4.  Pt will improve R SLS hold time to no less than 30 seconds for improved ankle stability and getting back to PLOF Baseline: unable Goal status: INITIAL  5.  Pt will improve R ankle MMT to no less  than 4/5 for all tested motions for improved functional ability and decrease pain Baseline: see MMT chart Goal status: INITIAL    PLAN:  PT FREQUENCY: 2x/week  PT DURATION: 8 weeks  PLANNED INTERVENTIONS: 97164- PT Re-evaluation, 97110-Therapeutic exercises, 97530- Therapeutic activity, [id number]- Neuromuscular re-education, 97535- Self Care, [id number]- Manual therapy, [id number]- Gait training, [phone]- Electrical stimulation (unattended), [id number]- Electrical stimulation (manual), 97016-  Vasopneumatic device, Dry Needling, Cryotherapy, and Moist heat  PLAN FOR NEXT SESSION: assess HEP resposne, gait training, ankle strengthening, progress boot>ASO brace   Arlester Bence, PT, DPT  08/18/2023 6:43 PM

## 2023-08-19 ENCOUNTER — Ambulatory Visit: Payer: Self-pay

## 2023-08-19 DIAGNOSIS — M25571 Pain in right ankle and joints of right foot: Secondary | ICD-10-CM

## 2023-08-19 DIAGNOSIS — R2689 Other abnormalities of gait and mobility: Secondary | ICD-10-CM

## 2023-08-19 DIAGNOSIS — M6281 Muscle weakness (generalized): Secondary | ICD-10-CM

## 2023-08-19 NOTE — Therapy (Signed)
 OUTPATIENT PHYSICAL THERAPY TREATMENT   Patient Name: Keith Mcconnell MRN: [id number] DOB:2000-07-14, 23 y.o., male Today's Date: 08/19/2023  END OF SESSION:  PT End of Session - 08/19/23 1227     Visit Number 5    Number of Visits 17    Date for PT Re-Evaluation 09/28/23    Authorization Type self pay    PT Start Time 1215    PT Stop Time 1255    PT Time Calculation (min) 40 min    Activity Tolerance Patient tolerated treatment well    Behavior During Therapy [facility] for tasks assessed/performed                  Past Medical History:  Diagnosis Date   ADHD    no meds   COVID-19 2021   Heart murmur    never has caused any problems   Hx of seasonal allergies    Past Surgical History:  Procedure Laterality Date   ORIF ANKLE FRACTURE Right 06/17/2023   Procedure: OPEN REDUCTION INTERNAL FIXATION (ORIF) ANKLE FRACTURE;  Surgeon: Wes Hamman, MD;  Location: [facility] OR;  Service: Orthopedics;  Laterality: Right;  ORIF RIGHT BIMALLEOLAR FRACTURE   TONSILLECTOMY     WISDOM TOOTH EXTRACTION     Patient Active Problem List   Diagnosis Date Noted   Bimalleolar ankle fracture, right, closed, initial encounter 06/16/2023    PCP: [facility]  REFERRING PROVIDER: Sandie Cross, PA-C  REFERRING DIAG: [phone] (ICD-10-CM) - Bimalleolar ankle fracture, right, closed, initial encounter   THERAPY DIAG:  Pain in right ankle and joints of right foot  Other abnormalities of gait and mobility  Muscle weakness (generalized)  Rationale for Evaluation and Treatment: Rehabilitation  ONSET DATE: 06/08/2023 - DOS 06/17/2023  SUBJECTIVE:   SUBJECTIVE STATEMENT:  Patient reporting that he has not tried walking with brace since last visit, "but I did walk barefoot some yesterday." Patient continues to report minimal pain at this time.    EVAL: Pt presents to PT s/p R ankle ORIF post bimalleolar fx on 06/17/2023. Notes decrease in pain over recent weeks but  has been nervous to start putting weight through his R LE. Denies N/T in R foot, does feel like it is getting tight.   PERTINENT HISTORY: ORIF R ankle 06/17/2023  PAIN:  Are you having pain?  Yes: NPRS scale: 0/10 Worst: 2/10 Pain location: R lateral ankle Pain description: tight, sore Aggravating factors: standing, sitting Relieving factors: rest, ice  PRECAUTIONS: None  RED FLAGS: None   WEIGHT BEARING RESTRICTIONS: WBAT in CAM boot  FALLS:  Has patient fallen in last 6 months? No  LIVING ENVIRONMENT: Lives with: lives with their family Lives in: House/apartment  OCCUPATION: Not currently working  PLOF: Independent  PATIENT GOALS: get back to PLOF and weight lifting  NEXT MD VISIT: Not currently scheduled  OBJECTIVE:  Note: Objective measures were completed at Evaluation unless otherwise noted.  DIAGNOSTIC FINDINGS: See imaging  PATIENT SURVEYS:   LEFS: 31/80  COGNITION: Overall cognitive status: Within functional limits for tasks assessed     SENSATION: WFL   POSTURE: flexed trunk   PALPATION: TTP to R fibularis longus/brevis   LOWER EXTREMITY ROM:  Active ROM Right eval Left eval  Hip flexion    Hip extension    Hip abduction    Hip adduction    Hip internal rotation    Hip external rotation    Knee flexion    Knee extension  Ankle dorsiflexion Lacking 5 to neutral WNL  Ankle plantarflexion    Ankle inversion    Ankle eversion     (Blank rows = not tested)  LOWER EXTREMITY MMT:  MMT Right eval Left eval  Hip flexion    Hip extension    Hip abduction    Hip adduction    Hip internal rotation    Hip external rotation    Knee flexion    Knee extension    Ankle dorsiflexion 2+ 5  Ankle plantarflexion 3 5  Ankle inversion 3 5  Ankle eversion 3 5   (Blank rows = not tested)  LOWER EXTREMITY SPECIAL TESTS:  DNT  FUNCTIONAL TESTS:  SLS: R - unable; L - 30 seconds  GAIT: Distance walked: 34ft Assistive device utilized:  Crutches Level of assistance: Modified independence Comments: started NWB on R LE - progressed via gait training to step-to gait with R LE in stance   TREATMENT:  [facility] Adult PT Treatment:                                                DATE: 08/19/2023  Therapeutic Activity:  NuStep 5', level 4  Weight Bearing with Parallel Bars  Ambulation with WB in brace Step ups to Airex 2 x 10 fwd Theraband pad 2 x 10 lateral STS with equal WB without CAM boot 2 x 5  Cone step overs Lateral hurdle step overs   Self-care:  ASO brace and crutches adjustments, along with patient education regarding proper fit and usage; goals for weaning from AD and brace.   [facility] Adult PT Treatment:                                                DATE: 08/17/2023  Therapeutic Activity:  NuStep 5', level 4  Weight Bearing with Parallel Bars  Ambulation with WB in CAM boot donned Step ups to Airex 2 x 10 fwd Theraband pad 2 x 10 lateral STS with equal WB without CAM boot 2 x 5  Seated heel and toe raises  Patient education regarding importance of weight bearing with boot focus in order to wean from boot and transition to ASO brace.   [facility] Adult PT Treatment:                                                DATE: 08/09/2023 Therapeutic Activity: NuStep 5' Standing weight shifts in CAM boot 2x10, 5s hold, UE assist PRN (got to a SLS on RLE) STS with equal WB cue in CAM boot 2X12,  Seated gastroc stretch w/strap 2x1' Seated soleus stretch 2x1'   [facility] Adult PT Treatment:                                                DATE: 08/06/2023 Therapeutic Exercise: NuStep lvl 5 LE only x 4 min for functional activity tolerance Long sitting calf stretch with strap 2x45" R R ankle DF/inv/ev/PF  2x10 YTB Seated R ankle heel-toe raises 3x15 R ankle ABC R ankle pumps x 20 R ankle circles x 20 cw/ccw Gait Training: Working on step-gait with WBAT R LE in // then x 226ft  Step-to gait with R stance in // - L UE support x 3  laps Modalities: GameReady: Right Ankle 10 min; 38 degrees Low compression Supine with R LE elevated   PATIENT EDUCATION:  Education details: continue HEP Person educated: Patient Education method: Explanation, Demonstration, and Handouts Education comprehension: verbalized understanding and returned demonstration  HOME EXERCISE PROGRAM: Access Code: [email or web address] URL: [email or web address] Date: 08/06/2023 Prepared by: Loral Roch  Exercises - Long Sitting Calf Stretch with Strap  - 2-3 x daily - 7 x weekly - 3 reps - 30 sec hold - Supine Ankle Pumps  - 2-3 x daily - 7 x weekly - 3 sets - 20 reps - Supine Ankle Circles  - 2-3 x daily - 7 x weekly - 3 sets - 20 reps - Side to Side Weight Shift with Counter Support  - 2-3 x daily - 7 x weekly - 2 sets - 10 reps - 5 sec hold - Ankle Dorsiflexion with Resistance  - 1 x daily - 7 x weekly - 2 sets - 10 reps - yellow hold - Ankle Inversion with Resistance  - 1 x daily - 7 x weekly - 2 sets - 10 reps - yellow hold - Ankle Eversion with Resistance  - 1 x daily - 7 x weekly - 2 sets - 10 reps - yellow hold - Ankle and Toe Plantarflexion with Resistance  - 1 x daily - 7 x weekly - 2 sets - 10 reps - yellow hold - Seated Heel Toe Raises  - 1 x daily - 7 x weekly - 2-3 sets - 15 reps  ASSESSMENT:  CLINICAL IMPRESSION: Nawaf had fair tolerance of today's treatment session, which focused on progression of weight bearing activities with ASO brace donned. Pain denies any symptom exacerbation at end of session, but does reports some muscle fatigue, which is at reasonable level for today's activities. We will continue to progress per POC as tolerated, in order to reach established rehab goals.     EVAL: Patient is a 23 y.o. M who was seen today for physical therapy evaluation and treatment s/p R ankle ORIF after bimalleolar fx on 06/16/2023. Physical findings are consistent with injury and recovery timeline as pt demonstrates decrease  in R ankle strength, ROM, stability and gait. LEFS score shows severe disability in performance of home ADLs and higher level community activities. Pt would benefit from skilled PT services post op in order to get back to PLOF.    OBJECTIVE IMPAIRMENTS: Abnormal gait, decreased activity tolerance, decreased balance, decreased mobility, difficulty walking, decreased ROM, decreased strength, and pain  ACTIVITY LIMITATIONS: carrying, lifting, sitting, standing, squatting, stairs, transfers, and locomotion level  PARTICIPATION LIMITATIONS: meal prep, cleaning, driving, shopping, community activity, occupation, and yard work  PERSONAL FACTORS: Time since onset of injury/illness/exacerbation are also affecting patient's functional outcome.   REHAB POTENTIAL: Excellent  CLINICAL DECISION MAKING: Stable/uncomplicated  EVALUATION COMPLEXITY: Low   GOALS: Goals reviewed with patient? No  SHORT TERM GOALS: Target date: 08/24/2023   Pt will be compliant and knowledgeable with initial HEP for improved comfort and carryover Baseline: initial HEP given  Goal status: INITIAL  2.  Pt will self report right ankle pain no greater than 1/10 for improved comfort and functional ability Baseline: 2/10 at worst Goal  status: INITIAL   LONG TERM GOALS: Target date: 09/28/2023  Pt will improve LEFS to no less than 55/80 as proxy for functional improvement with home ADLs and higher level community activity Baseline: 31/80 Goal status: INITIAL   2.  Pt will self report right ankle pain no greater than 0/10 for improved comfort and functional ability Baseline: 2/10 at worst Goal status: INITIAL   3.  Pt will improve R ankle DF AROM to no less than 10 degrees for improved functional mobility and decrease pain Baseline: lacking 5 degrees Goal status: INITIAL  4.  Pt will improve R SLS hold time to no less than 30 seconds for improved ankle stability and getting back to PLOF Baseline: unable Goal status:  INITIAL  5.  Pt will improve R ankle MMT to no less than 4/5 for all tested motions for improved functional ability and decrease pain Baseline: see MMT chart Goal status: INITIAL    PLAN:  PT FREQUENCY: 2x/week  PT DURATION: 8 weeks  PLANNED INTERVENTIONS: 97164- PT Re-evaluation, 97110-Therapeutic exercises, 97530- Therapeutic activity, [id number]- Neuromuscular re-education, 97535- Self Care, 16109- Manual therapy, [id number]- Gait training, U0454- Electrical stimulation (unattended), [id number]- Electrical stimulation (manual), 97016- Vasopneumatic device, Dry Needling, Cryotherapy, and Moist heat  PLAN FOR NEXT SESSION: assess HEP resposne, gait training, ankle strengthening, progress boot>ASO brace   Arlester Bence, PT, DPT  08/19/2023 2:15 PM

## 2023-08-24 ENCOUNTER — Ambulatory Visit: Payer: Self-pay

## 2023-08-24 DIAGNOSIS — R2689 Other abnormalities of gait and mobility: Secondary | ICD-10-CM

## 2023-08-24 DIAGNOSIS — M6281 Muscle weakness (generalized): Secondary | ICD-10-CM

## 2023-08-24 DIAGNOSIS — R600 Localized edema: Secondary | ICD-10-CM

## 2023-08-24 DIAGNOSIS — M25571 Pain in right ankle and joints of right foot: Secondary | ICD-10-CM

## 2023-08-24 NOTE — Therapy (Signed)
 OUTPATIENT PHYSICAL THERAPY TREATMENT   Patient Name: Keith Mcconnell MRN: [id number] DOB:[birth year], 23 y.o., male Today's Date: 08/24/2023  END OF SESSION:  PT End of Session - 08/24/23 1144     Visit Number 6    Number of Visits 17    Date for PT Re-Evaluation 09/28/23    Authorization Type self pay    PT Start Time 1135    PT Stop Time 1213    PT Time Calculation (min) 38 min    Activity Tolerance Patient tolerated treatment well    Behavior During Therapy [facility] for tasks assessed/performed                   Past Medical History:  Diagnosis Date   ADHD    no meds   COVID-19 2021   Heart murmur    never has caused any problems   Hx of seasonal allergies    Past Surgical History:  Procedure Laterality Date   ORIF ANKLE FRACTURE Right 06/17/2023   Procedure: OPEN REDUCTION INTERNAL FIXATION (ORIF) ANKLE FRACTURE;  Surgeon: Wes Hamman, MD;  Location: [facility] OR;  Service: Orthopedics;  Laterality: Right;  ORIF RIGHT BIMALLEOLAR FRACTURE   TONSILLECTOMY     WISDOM TOOTH EXTRACTION     Patient Active Problem List   Diagnosis Date Noted   Bimalleolar ankle fracture, right, closed, initial encounter 06/16/2023    PCP: [facility]  REFERRING PROVIDER: Sandie Cross, PA-C  REFERRING DIAG: [phone] (ICD-10-CM) - Bimalleolar ankle fracture, right, closed, initial encounter   THERAPY DIAG:  Pain in right ankle and joints of right foot  Other abnormalities of gait and mobility  Muscle weakness (generalized)  Localized edema  Rationale for Evaluation and Treatment: Rehabilitation  ONSET DATE: 06/08/2023 - DOS 06/17/2023  SUBJECTIVE:   SUBJECTIVE STATEMENT:  Patient reports that he has been walking with ASO brace and without crutches  EVAL: Pt presents to PT s/p R ankle ORIF post bimalleolar fx on 06/17/2023. Notes decrease in pain over recent weeks but has been nervous to start putting weight through his R LE. Denies N/T in R foot,  does feel like it is getting tight.   PERTINENT HISTORY: ORIF R ankle 06/17/2023  PAIN:  Are you having pain?  Yes: NPRS scale: 0/10 Worst: 2/10 Pain location: R lateral ankle Pain description: tight, sore Aggravating factors: standing, sitting Relieving factors: rest, ice  PRECAUTIONS: None  RED FLAGS: None   WEIGHT BEARING RESTRICTIONS: WBAT in CAM boot  FALLS:  Has patient fallen in last 6 months? No  LIVING ENVIRONMENT: Lives with: lives with their family Lives in: House/apartment  OCCUPATION: Not currently working  PLOF: Independent  PATIENT GOALS: get back to PLOF and weight lifting  NEXT MD VISIT: Not currently scheduled  OBJECTIVE:  Note: Objective measures were completed at Evaluation unless otherwise noted.  DIAGNOSTIC FINDINGS: See imaging  PATIENT SURVEYS:   LEFS: 31/80  COGNITION: Overall cognitive status: Within functional limits for tasks assessed     SENSATION: WFL   POSTURE: flexed trunk   PALPATION: TTP to R fibularis longus/brevis   LOWER EXTREMITY ROM:  Active ROM Right eval Left eval  Hip flexion    Hip extension    Hip abduction    Hip adduction    Hip internal rotation    Hip external rotation    Knee flexion    Knee extension    Ankle dorsiflexion Lacking 5 to neutral WNL  Ankle plantarflexion  Ankle inversion    Ankle eversion     (Blank rows = not tested)  LOWER EXTREMITY MMT:  MMT Right eval Left eval  Hip flexion    Hip extension    Hip abduction    Hip adduction    Hip internal rotation    Hip external rotation    Knee flexion    Knee extension    Ankle dorsiflexion 2+ 5  Ankle plantarflexion 3 5  Ankle inversion 3 5  Ankle eversion 3 5   (Blank rows = not tested)  LOWER EXTREMITY SPECIAL TESTS:  DNT  FUNCTIONAL TESTS:  SLS: R - unable; L - 30 seconds  GAIT: Distance walked: 101ft Assistive device utilized: Crutches Level of assistance: Modified independence Comments: started NWB on R  LE - progressed via gait training to step-to gait with R LE in stance   TREATMENT:  [facility] Adult PT Treatment:                                                DATE: 08/24/2023  Therapeutic Activity:  NuStep 5', level 4  Weight Bearing with Parallel Bars  Cone and hurdle step overs 2 x 10  SLS cone taps from theraband pad  Airex step ups onto R TB  2 x 10  STS with equal WB with ASO brace 2 x 5  Cone step overs Lateral hurdle step overs   Therapeutic Exercise:  Seated Dynadisc PF/DF, supination, pronation x 30 sec each  Seated Dynadisc circles x 10 cw/ccw  Seated ankle 4-way with blue TB x 15 each direction   Self-care:  ASO brace adjustments, along with patient education regarding proper fit and usage; goals for weaning from AD and brace.      PATIENT EDUCATION:  Education details: continue HEP Person educated: Patient Education method: Explanation, Demonstration, and Handouts Education comprehension: verbalized understanding and returned demonstration  HOME EXERCISE PROGRAM: Access Code: [email or web address] URL: [email or web address] Date: 08/06/2023 Prepared by: Loral Roch  Exercises - Long Sitting Calf Stretch with Strap  - 2-3 x daily - 7 x weekly - 3 reps - 30 sec hold - Supine Ankle Pumps  - 2-3 x daily - 7 x weekly - 3 sets - 20 reps - Supine Ankle Circles  - 2-3 x daily - 7 x weekly - 3 sets - 20 reps - Side to Side Weight Shift with Counter Support  - 2-3 x daily - 7 x weekly - 2 sets - 10 reps - 5 sec hold - Ankle Dorsiflexion with Resistance  - 1 x daily - 7 x weekly - 2 sets - 10 reps - yellow hold - Ankle Inversion with Resistance  - 1 x daily - 7 x weekly - 2 sets - 10 reps - yellow hold - Ankle Eversion with Resistance  - 1 x daily - 7 x weekly - 2 sets - 10 reps - yellow hold - Ankle and Toe Plantarflexion with Resistance  - 1 x daily - 7 x weekly - 2 sets - 10 reps - yellow hold - Seated Heel Toe Raises  - 1 x daily - 7 x weekly - 2-3 sets - 15  reps  ASSESSMENT:  CLINICAL IMPRESSION: Keith Mcconnell had fair tolerance of today's treatment session, which focused on progression of weight bearing activities with ASO brace donned. Pain denies any symptom exacerbation at  end of session, but does reports some muscle fatigue, which is at reasonable level for today's activities. We will continue to progress per POC as tolerated, in order to reach established rehab goals.     EVAL: Patient is a 23 y.o. M who was seen today for physical therapy evaluation and treatment s/p R ankle ORIF after bimalleolar fx on 06/16/2023. Physical findings are consistent with injury and recovery timeline as pt demonstrates decrease in R ankle strength, ROM, stability and gait. LEFS score shows severe disability in performance of home ADLs and higher level community activities. Pt would benefit from skilled PT services post op in order to get back to PLOF.    OBJECTIVE IMPAIRMENTS: Abnormal gait, decreased activity tolerance, decreased balance, decreased mobility, difficulty walking, decreased ROM, decreased strength, and pain  ACTIVITY LIMITATIONS: carrying, lifting, sitting, standing, squatting, stairs, transfers, and locomotion level  PARTICIPATION LIMITATIONS: meal prep, cleaning, driving, shopping, community activity, occupation, and yard work  PERSONAL FACTORS: Time since onset of injury/illness/exacerbation are also affecting patient's functional outcome.   REHAB POTENTIAL: Excellent  CLINICAL DECISION MAKING: Stable/uncomplicated  EVALUATION COMPLEXITY: Low   GOALS: Goals reviewed with patient? No  SHORT TERM GOALS: Target date: 08/24/2023   Pt will be compliant and knowledgeable with initial HEP for improved comfort and carryover Baseline: initial HEP given  Goal status: INITIAL  2.  Pt will self report right ankle pain no greater than 1/10 for improved comfort and functional ability Baseline: 2/10 at worst Goal status: INITIAL   LONG TERM GOALS:  Target date: 09/28/2023  Pt will improve LEFS to no less than 55/80 as proxy for functional improvement with home ADLs and higher level community activity Baseline: 31/80 Goal status: INITIAL   2.  Pt will self report right ankle pain no greater than 0/10 for improved comfort and functional ability Baseline: 2/10 at worst Goal status: INITIAL   3.  Pt will improve R ankle DF AROM to no less than 10 degrees for improved functional mobility and decrease pain Baseline: lacking 5 degrees Goal status: INITIAL  4.  Pt will improve R SLS hold time to no less than 30 seconds for improved ankle stability and getting back to PLOF Baseline: unable Goal status: INITIAL  5.  Pt will improve R ankle MMT to no less than 4/5 for all tested motions for improved functional ability and decrease pain Baseline: see MMT chart Goal status: INITIAL    PLAN:  PT FREQUENCY: 2x/week  PT DURATION: 8 weeks  PLANNED INTERVENTIONS: 97164- PT Re-evaluation, 97110-Therapeutic exercises, 97530- Therapeutic activity, [id number]- Neuromuscular re-education, 97535- Self Care, 16109- Manual therapy, [id number]- Gait training, U0454- Electrical stimulation (unattended), [id number]- Electrical stimulation (manual), 97016- Vasopneumatic device, Dry Needling, Cryotherapy, and Moist heat  PLAN FOR NEXT SESSION: assess HEP resposne, gait training, ankle strengthening, progress boot>ASO brace   Arlester Bence, PT, DPT  08/24/2023 11:47 AM

## 2023-09-03 ENCOUNTER — Ambulatory Visit: Payer: Self-pay | Attending: Physician Assistant

## 2023-09-03 DIAGNOSIS — M25571 Pain in right ankle and joints of right foot: Secondary | ICD-10-CM | POA: Insufficient documentation

## 2023-09-03 DIAGNOSIS — R2689 Other abnormalities of gait and mobility: Secondary | ICD-10-CM | POA: Insufficient documentation

## 2023-09-03 DIAGNOSIS — R600 Localized edema: Secondary | ICD-10-CM | POA: Insufficient documentation

## 2023-09-03 DIAGNOSIS — M6281 Muscle weakness (generalized): Secondary | ICD-10-CM | POA: Insufficient documentation

## 2023-09-03 NOTE — Therapy (Signed)
 OUTPATIENT PHYSICAL THERAPY TREATMENT   Patient Name: Keith Mcconnell MRN: [id number] DOB:April 29, 2000, 23 y.o., male Today's Date: 09/03/2023  END OF SESSION:  PT End of Session - 09/03/23 1220     Visit Number 7    Number of Visits 17    Date for PT Re-Evaluation 09/28/23    Authorization Type self pay    PT Start Time 1220    PT Stop Time 1258    PT Time Calculation (min) 38 min    Activity Tolerance Patient tolerated treatment well    Behavior During Therapy [facility] for tasks assessed/performed                    Past Medical History:  Diagnosis Date   ADHD    no meds   COVID-19 2021   Heart murmur    never has caused any problems   Hx of seasonal allergies    Past Surgical History:  Procedure Laterality Date   ORIF ANKLE FRACTURE Right 06/17/2023   Procedure: OPEN REDUCTION INTERNAL FIXATION (ORIF) ANKLE FRACTURE;  Surgeon: Wes Hamman, MD;  Location: [facility] OR;  Service: Orthopedics;  Laterality: Right;  ORIF RIGHT BIMALLEOLAR FRACTURE   TONSILLECTOMY     WISDOM TOOTH EXTRACTION     Patient Active Problem List   Diagnosis Date Noted   Bimalleolar ankle fracture, right, closed, initial encounter 06/16/2023    PCP: [facility]  REFERRING PROVIDER: Sandie Cross, PA-C  REFERRING DIAG: [phone] (ICD-10-CM) - Bimalleolar ankle fracture, right, closed, initial encounter   THERAPY DIAG:  Pain in right ankle and joints of right foot  Other abnormalities of gait and mobility  Muscle weakness (generalized)  Rationale for Evaluation and Treatment: Rehabilitation  ONSET DATE: 06/08/2023 - DOS 06/17/2023  SUBJECTIVE:   SUBJECTIVE STATEMENT:  09/03/2023 Patient reports that he was able to walk with boot at a concert last week. He denies any pain at this time.   EVAL: Pt presents to PT s/p R ankle ORIF post bimalleolar fx on 06/17/2023. Notes decrease in pain over recent weeks but has been nervous to start putting weight through his R  LE. Denies N/T in R foot, does feel like it is getting tight.   PERTINENT HISTORY: ORIF R ankle 06/17/2023  PAIN:  Are you having pain?  Yes: NPRS scale: 0/10 Worst: 2/10 Pain location: R lateral ankle Pain description: tight, sore Aggravating factors: standing, sitting Relieving factors: rest, ice  PRECAUTIONS: None  RED FLAGS: None   WEIGHT BEARING RESTRICTIONS: WBAT in CAM boot  FALLS:  Has patient fallen in last 6 months? No  LIVING ENVIRONMENT: Lives with: lives with their family Lives in: House/apartment  OCCUPATION: Not currently working  PLOF: Independent  PATIENT GOALS: get back to PLOF and weight lifting  NEXT MD VISIT: Not currently scheduled  OBJECTIVE:  Note: Objective measures were completed at Evaluation unless otherwise noted.  DIAGNOSTIC FINDINGS: See imaging  PATIENT SURVEYS:   LEFS: 31/80  COGNITION: Overall cognitive status: Within functional limits for tasks assessed     SENSATION: WFL   POSTURE: flexed trunk   PALPATION: TTP to R fibularis longus/brevis   LOWER EXTREMITY ROM:  Active ROM Right eval Left eval  Hip flexion    Hip extension    Hip abduction    Hip adduction    Hip internal rotation    Hip external rotation    Knee flexion    Knee extension    Ankle dorsiflexion  Lacking 5 to neutral WNL  Ankle plantarflexion    Ankle inversion    Ankle eversion     (Blank rows = not tested)  LOWER EXTREMITY MMT:  MMT Right eval Left eval  Hip flexion    Hip extension    Hip abduction    Hip adduction    Hip internal rotation    Hip external rotation    Knee flexion    Knee extension    Ankle dorsiflexion 2+ 5  Ankle plantarflexion 3 5  Ankle inversion 3 5  Ankle eversion 3 5   (Blank rows = not tested)  LOWER EXTREMITY SPECIAL TESTS:  DNT  FUNCTIONAL TESTS:  SLS: R - unable; L - 30 seconds  GAIT: Distance walked: 18ft Assistive device utilized: Crutches Level of assistance: Modified  independence Comments: started NWB on R LE - progressed via gait training to step-to gait with R LE in stance   TREATMENT:  [facility] Adult PT Treatment:                                                DATE: 09/03/2023  Therapeutic Activity:  NuStep 5' x level 5  STS with equal WB with ASO brace 3 x 5  Standing cone taps (10) from airex 3 rounds x 10; SBA/supervison needed for safety Cone step overs to improve gait mechanics with ASO donned x 3 laps  Lateral hurdle step overs 2 x 30 sec (2 airex pads, 1 hurdle in between)  Tandem walking on airex beam x 3 laps (fwd only) Tandem stance on airex beam 2 x 30 sec  R SLS with CL task (soccer ball semicircles) 3 x 30 sec      PATIENT EDUCATION:  Education details: continue HEP Person educated: Patient Education method: Explanation, Demonstration, and Handouts Education comprehension: verbalized understanding and returned demonstration  HOME EXERCISE PROGRAM: Access Code: [email or web address] URL: [email or web address] Date: 08/06/2023 Prepared by: Loral Roch  Exercises - Long Sitting Calf Stretch with Strap  - 2-3 x daily - 7 x weekly - 3 reps - 30 sec hold - Supine Ankle Pumps  - 2-3 x daily - 7 x weekly - 3 sets - 20 reps - Supine Ankle Circles  - 2-3 x daily - 7 x weekly - 3 sets - 20 reps - Side to Side Weight Shift with Counter Support  - 2-3 x daily - 7 x weekly - 2 sets - 10 reps - 5 sec hold - Ankle Dorsiflexion with Resistance  - 1 x daily - 7 x weekly - 2 sets - 10 reps - yellow hold - Ankle Inversion with Resistance  - 1 x daily - 7 x weekly - 2 sets - 10 reps - yellow hold - Ankle Eversion with Resistance  - 1 x daily - 7 x weekly - 2 sets - 10 reps - yellow hold - Ankle and Toe Plantarflexion with Resistance  - 1 x daily - 7 x weekly - 2 sets - 10 reps - yellow hold - Seated Heel Toe Raises  - 1 x daily - 7 x weekly - 2-3 sets - 15 reps  ASSESSMENT:  CLINICAL IMPRESSION: [name] had good tolerance of today's treatment  session, which focused on increased time address WB tolerance and gait mechanics. Pain denies any symptom exacerbation at end of session. Patient is strongly encouraged to  ambulate with ASO brace and discontinue use of boot at this time. We will continue to progress per POC as tolerated, in order to reach established rehab goals.     EVAL: Patient is a 23 y.o. M who was seen today for physical therapy evaluation and treatment s/p R ankle ORIF after bimalleolar fx on 06/16/2023. Physical findings are consistent with injury and recovery timeline as pt demonstrates decrease in R ankle strength, ROM, stability and gait. LEFS score shows severe disability in performance of home ADLs and higher level community activities. Pt would benefit from skilled PT services post op in order to get back to PLOF.    OBJECTIVE IMPAIRMENTS: Abnormal gait, decreased activity tolerance, decreased balance, decreased mobility, difficulty walking, decreased ROM, decreased strength, and pain  ACTIVITY LIMITATIONS: carrying, lifting, sitting, standing, squatting, stairs, transfers, and locomotion level  PARTICIPATION LIMITATIONS: meal prep, cleaning, driving, shopping, community activity, occupation, and yard work  PERSONAL FACTORS: Time since onset of injury/illness/exacerbation are also affecting patient's functional outcome.   REHAB POTENTIAL: Excellent  CLINICAL DECISION MAKING: Stable/uncomplicated  EVALUATION COMPLEXITY: Low   GOALS: Goals reviewed with patient? No  SHORT TERM GOALS: Target date: 08/24/2023   Pt will be compliant and knowledgeable with initial HEP for improved comfort and carryover Baseline: initial HEP given  Goal status: INITIAL  2.  Pt will self report right ankle pain no greater than 1/10 for improved comfort and functional ability Baseline: 2/10 at worst Goal status: INITIAL   LONG TERM GOALS: Target date: 09/28/2023  Pt will improve LEFS to no less than 55/80 as proxy for functional  improvement with home ADLs and higher level community activity Baseline: 31/80 Goal status: INITIAL   2.  Pt will self report right ankle pain no greater than 0/10 for improved comfort and functional ability Baseline: 2/10 at worst Goal status: INITIAL   3.  Pt will improve R ankle DF AROM to no less than 10 degrees for improved functional mobility and decrease pain Baseline: lacking 5 degrees Goal status: INITIAL  4.  Pt will improve R SLS hold time to no less than 30 seconds for improved ankle stability and getting back to PLOF Baseline: unable Goal status: INITIAL  5.  Pt will improve R ankle MMT to no less than 4/5 for all tested motions for improved functional ability and decrease pain Baseline: see MMT chart Goal status: INITIAL    PLAN:  PT FREQUENCY: 2x/week  PT DURATION: 8 weeks  PLANNED INTERVENTIONS: 97164- PT Re-evaluation, 97110-Therapeutic exercises, 97530- Therapeutic activity, [id number]- Neuromuscular re-education, 97535- Self Care, [id number]- Manual therapy, [id number]- Gait training, [id number]- Electrical stimulation (unattended), [id number]- Electrical stimulation (manual), 97016- Vasopneumatic device, Dry Needling, Cryotherapy, and Moist heat  PLAN FOR NEXT SESSION: assess HEP resposne, gait training, ankle strengthening, progress boot>ASO brace   Arlester Bence, PT, DPT  09/03/2023 1:02 PM

## 2023-09-06 NOTE — Therapy (Signed)
 OUTPATIENT PHYSICAL THERAPY TREATMENT   Patient Name: Keith Mcconnell MRN: [id number] DOB:06-12-2000, 23 y.o., male Today's Date: 09/06/2023  END OF SESSION:           Past Medical History:  Diagnosis Date   ADHD    no meds   COVID-19 2021   Heart murmur    never has caused any problems   Hx of seasonal allergies    Past Surgical History:  Procedure Laterality Date   ORIF ANKLE FRACTURE Right 06/17/2023   Procedure: OPEN REDUCTION INTERNAL FIXATION (ORIF) ANKLE FRACTURE;  Surgeon: Wes Hamman, MD;  Location: [facility] OR;  Service: Orthopedics;  Laterality: Right;  ORIF RIGHT BIMALLEOLAR FRACTURE   TONSILLECTOMY     WISDOM TOOTH EXTRACTION     Patient Active Problem List   Diagnosis Date Noted   Bimalleolar ankle fracture, right, closed, initial encounter 06/16/2023    [facility]P: [facility]  REFERRING PROVIDER: Sandie Cross, PA-C  REFERRING DIAG: [phone] (ICD-10-CM) - Bimalleolar ankle fracture, right, closed, initial encounter   THERAPY DIAG:  No diagnosis found.  Rationale for Evaluation and Treatment: Rehabilitation  ONSET DATE: 06/08/2023 - DOS 06/17/2023  SUBJECTIVE:   SUBJECTIVE STATEMENT:  09/06/2023 ; ***  *** Patient reports that he was able to walk with boot at a concert last week. He denies any pain at this time.   EVAL: Pt presents to PT s/p R ankle ORIF post bimalleolar fx on 06/17/2023. Notes decrease in pain over recent weeks but has been nervous to start putting weight through his R LE. Denies N/T in R foot, does feel like it is getting tight.   PERTINENT HISTORY: ORIF R ankle 06/17/2023  PAIN:  Are you having pain?  Yes: NPRS scale: 0/10 ***  Worst: 2/10 Pain location: R lateral ankle Pain description: tight, sore Aggravating factors: standing, sitting Relieving factors: rest, ice  PRECAUTIONS: None  RED FLAGS: None   WEIGHT BEARING RESTRICTIONS: WBAT in CAM boot  FALLS:  Has patient fallen in last 6 months?  No  LIVING ENVIRONMENT: Lives with: lives with their family Lives in: House/apartment  OCCUPATION: Not currently working  PLOF: Independent  PATIENT GOALS: get back to PLOF and weight lifting  NEXT MD VISIT: Not currently scheduled  OBJECTIVE:  Note: Objective measures were completed at Evaluation unless otherwise noted.  DIAGNOSTIC FINDINGS: See imaging  PATIENT SURVEYS:   LEFS: 31/80  COGNITION: Overall cognitive status: Within functional limits for tasks assessed     SENSATION: WFL   POSTURE: flexed trunk   PALPATION: TTP to R fibularis longus/brevis   LOWER EXTREMITY ROM:  Active ROM Right eval Left eval  Hip flexion    Hip extension    Hip abduction    Hip adduction    Hip internal rotation    Hip external rotation    Knee flexion    Knee extension    Ankle dorsiflexion Lacking 5 to neutral WNL  Ankle plantarflexion    Ankle inversion    Ankle eversion     (Blank rows = not tested)  LOWER EXTREMITY MMT:  MMT Right eval Left eval  Hip flexion    Hip extension    Hip abduction    Hip adduction    Hip internal rotation    Hip external rotation    Knee flexion    Knee extension    Ankle dorsiflexion 2+ 5  Ankle plantarflexion 3 5  Ankle inversion 3 5  Ankle eversion 3 5   (  Blank rows = not tested)  LOWER EXTREMITY SPECIAL TESTS:  DNT  FUNCTIONAL TESTS:  SLS: R - unable; L - 30 seconds  GAIT: Distance walked: 19ft Assistive device utilized: Crutches Level of assistance: Modified independence Comments: started NWB on R LE - progressed via gait training to step-to gait with R LE in stance   TREATMENT:  [facility] Adult PT Treatment:                                                DATE: 09/07/23 Therapeutic Exercise: *** Manual Therapy: *** Neuromuscular re-ed: *** Therapeutic Activity: *** Modalities: *** Self Care: ***     Keith Mcconnell Adult PT Treatment:                                                DATE: 09/03/2023  Therapeutic  Activity:  NuStep 5' x level 5  STS with equal WB with ASO brace 3 x 5  Standing cone taps (10) from airex 3 rounds x 10; SBA/supervison needed for safety Cone step overs to improve gait mechanics with ASO donned x 3 laps  Lateral hurdle step overs 2 x 30 sec (2 airex pads, 1 hurdle in between)  Tandem walking on airex beam x 3 laps (fwd only) Tandem stance on airex beam 2 x 30 sec  R SLS with CL task (soccer ball semicircles) 3 x 30 sec      PATIENT EDUCATION:  Education details: rationale for interventions, HEP  Person educated: Patient Education method: Explanation, Demonstration, Tactile cues, Verbal cues Education comprehension: verbalized understanding, returned demonstration, verbal cues required, tactile cues required, and needs further education     HOME EXERCISE PROGRAM: Access Code: [email or web address] URL: [email or web address] Date: 08/06/2023 Prepared by: Loral Roch  Exercises - Long Sitting Calf Stretch with Strap  - 2-3 x daily - 7 x weekly - 3 reps - 30 sec hold - Supine Ankle Pumps  - 2-3 x daily - 7 x weekly - 3 sets - 20 reps - Supine Ankle Circles  - 2-3 x daily - 7 x weekly - 3 sets - 20 reps - Side to Side Weight Shift with Counter Support  - 2-3 x daily - 7 x weekly - 2 sets - 10 reps - 5 sec hold - Ankle Dorsiflexion with Resistance  - 1 x daily - 7 x weekly - 2 sets - 10 reps - yellow hold - Ankle Inversion with Resistance  - 1 x daily - 7 x weekly - 2 sets - 10 reps - yellow hold - Ankle Eversion with Resistance  - 1 x daily - 7 x weekly - 2 sets - 10 reps - yellow hold - Ankle and Toe Plantarflexion with Resistance  - 1 x daily - 7 x weekly - 2 sets - 10 reps - yellow hold - Seated Heel Toe Raises  - 1 x daily - 7 x weekly - 2-3 sets - 15 reps  ASSESSMENT:  CLINICAL IMPRESSION: 09/06/2023 ; ***  *** Keith Mcconnell had good tolerance of today's treatment session, which focused on increased time address WB tolerance and gait mechanics. Pain denies any  symptom exacerbation at end of session. Patient is strongly encouraged to ambulate with ASO brace and  discontinue use of boot at this time. We will continue to progress per POC as tolerated, in order to reach established rehab goals.     EVAL: Patient is a 23 y.o. M who was seen today for physical therapy evaluation and treatment s/p R ankle ORIF after bimalleolar fx on 06/16/2023. Physical findings are consistent with injury and recovery timeline as pt demonstrates decrease in R ankle strength, ROM, stability and gait. LEFS score shows severe disability in performance of home ADLs and higher level community activities. Pt would benefit from skilled PT services post op in order to get back to PLOF.    OBJECTIVE IMPAIRMENTS: Abnormal gait, decreased activity tolerance, decreased balance, decreased mobility, difficulty walking, decreased ROM, decreased strength, and pain  ACTIVITY LIMITATIONS: carrying, lifting, sitting, standing, squatting, stairs, transfers, and locomotion level  PARTICIPATION LIMITATIONS: meal prep, cleaning, driving, shopping, community activity, occupation, and yard work  PERSONAL FACTORS: Time since onset of injury/illness/exacerbation are also affecting patient's functional outcome.   REHAB POTENTIAL: Excellent  CLINICAL DECISION MAKING: Stable/uncomplicated  EVALUATION COMPLEXITY: Low   GOALS: Goals reviewed with patient? No  SHORT TERM GOALS: Target date: 08/24/2023   Pt will be compliant and knowledgeable with initial HEP for improved comfort and carryover Baseline: initial HEP given  09/07/23: *** Goal status: ***  2.  Pt will self report right ankle pain no greater than 1/10 for improved comfort and functional ability Baseline: 2/10 at worst 09/07/23: *** Goal status: ***  LONG TERM GOALS: Target date: 09/28/2023  Pt will improve LEFS to no less than 55/80 as proxy for functional improvement with home ADLs and higher level community activity Baseline:  31/80 Goal status: INITIAL   2.  Pt will self report right ankle pain no greater than 0/10 for improved comfort and functional ability Baseline: 2/10 at worst Goal status: INITIAL   3.  Pt will improve R ankle DF AROM to no less than 10 degrees for improved functional mobility and decrease pain Baseline: lacking 5 degrees Goal status: INITIAL  4.  Pt will improve R SLS hold time to no less than 30 seconds for improved ankle stability and getting back to PLOF Baseline: unable Goal status: INITIAL  5.  Pt will improve R ankle MMT to no less than 4/5 for all tested motions for improved functional ability and decrease pain Baseline: see MMT chart Goal status: INITIAL    PLAN:  PT FREQUENCY: 2x/week  PT DURATION: 8 weeks  PLANNED INTERVENTIONS: 97164- PT Re-evaluation, 97110-Therapeutic exercises, 97530- Therapeutic activity, [id number]- Neuromuscular re-education, 97535- Self Care, [id number]- Manual therapy, [id number]- Gait training, [id number]- Electrical stimulation (unattended), [id number]- Electrical stimulation (manual), 97016- Vasopneumatic device, Dry Needling, Cryotherapy, and Moist heat  PLAN FOR NEXT SESSION: assess HEP resposne, gait training, ankle strengthening, progress boot>ASO brace   Lovett Ruck PT, DPT 09/06/2023 3:15 PM

## 2023-09-07 ENCOUNTER — Ambulatory Visit: Payer: Self-pay | Admitting: Physical Therapy

## 2023-09-07 ENCOUNTER — Encounter: Payer: Self-pay | Admitting: Physical Therapy

## 2023-09-07 DIAGNOSIS — M6281 Muscle weakness (generalized): Secondary | ICD-10-CM

## 2023-09-07 DIAGNOSIS — R2689 Other abnormalities of gait and mobility: Secondary | ICD-10-CM

## 2023-09-07 DIAGNOSIS — R600 Localized edema: Secondary | ICD-10-CM

## 2023-09-07 DIAGNOSIS — M25571 Pain in right ankle and joints of right foot: Secondary | ICD-10-CM

## 2023-09-15 ENCOUNTER — Ambulatory Visit: Payer: Self-pay

## 2023-09-15 DIAGNOSIS — M6281 Muscle weakness (generalized): Secondary | ICD-10-CM

## 2023-09-15 DIAGNOSIS — M25571 Pain in right ankle and joints of right foot: Secondary | ICD-10-CM

## 2023-09-15 DIAGNOSIS — R600 Localized edema: Secondary | ICD-10-CM

## 2023-09-15 DIAGNOSIS — R2689 Other abnormalities of gait and mobility: Secondary | ICD-10-CM

## 2023-09-15 NOTE — Therapy (Signed)
 OUTPATIENT PHYSICAL THERAPY TREATMENT   Patient Name: Jeremia Groot MRN: [id number] DOB:01-Aug-2000, 23 y.o., male Today's Date: 09/15/2023  END OF SESSION:  PT End of Session - 09/15/23 1410     Visit Number 9    Number of Visits 17    Date for PT Re-Evaluation 09/28/23    Authorization Type self pay    PT Start Time 1402    PT Stop Time 1442    PT Time Calculation (min) 40 min    Activity Tolerance Patient tolerated treatment well    Behavior During Therapy [facility] for tasks assessed/performed                   Past Medical History:  Diagnosis Date   ADHD    no meds   COVID-19 2021   Heart murmur    never has caused any problems   Hx of seasonal allergies    Past Surgical History:  Procedure Laterality Date   ORIF ANKLE FRACTURE Right 06/17/2023   Procedure: OPEN REDUCTION INTERNAL FIXATION (ORIF) ANKLE FRACTURE;  Surgeon: Wes Hamman, MD;  Location: [facility] OR;  Service: Orthopedics;  Laterality: Right;  ORIF RIGHT BIMALLEOLAR FRACTURE   TONSILLECTOMY     WISDOM TOOTH EXTRACTION     Patient Active Problem List   Diagnosis Date Noted   Bimalleolar ankle fracture, right, closed, initial encounter 06/16/2023    PCP: [facility]  REFERRING PROVIDER: Sandie Cross, PA-C  REFERRING DIAG: [phone] (ICD-10-CM) - Bimalleolar ankle fracture, right, closed, initial encounter   THERAPY DIAG:  Pain in right ankle and joints of right foot  Other abnormalities of gait and mobility  Muscle weakness (generalized)  Localized edema  Rationale for Evaluation and Treatment: Rehabilitation  ONSET DATE: 06/08/2023 - DOS 06/17/2023  SUBJECTIVE:   SUBJECTIVE STATEMENT:  09/15/2023 patient reports that he only has pain under lateral-plantar aspect of foot. He is ambulating in regular shoes, without brace or boot today.    EVAL: Pt presents to PT s/p R ankle ORIF post bimalleolar fx on 06/17/2023. Notes decrease in pain over recent weeks but has  been nervous to start putting weight through his R LE. Denies N/T in R foot, does feel like it is getting tight.   PERTINENT HISTORY: ORIF R ankle 06/17/2023  PAIN:  Are you having pain?  Yes: NPRS scale: 0/10   Worst: 2/10 Pain location: R lateral ankle Pain description: tight, sore Aggravating factors: standing, sitting Relieving factors: rest, ice  PRECAUTIONS: None  RED FLAGS: None   WEIGHT BEARING RESTRICTIONS: WBAT in CAM boot  FALLS:  Has patient fallen in last 6 months? No  LIVING ENVIRONMENT: Lives with: lives with their family Lives in: House/apartment  OCCUPATION: Not currently working  PLOF: Independent  PATIENT GOALS: get back to PLOF and weight lifting  NEXT MD VISIT: Not currently scheduled  OBJECTIVE:  Note: Objective measures were completed at Evaluation unless otherwise noted.  DIAGNOSTIC FINDINGS: See imaging  PATIENT SURVEYS:   LEFS: 31/80  COGNITION: Overall cognitive status: Within functional limits for tasks assessed     SENSATION: WFL   POSTURE: flexed trunk   PALPATION: TTP to R fibularis longus/brevis   LOWER EXTREMITY ROM:  Active ROM Right eval Left eval  Hip flexion    Hip extension    Hip abduction    Hip adduction    Hip internal rotation    Hip external rotation    Knee flexion    Knee  extension    Ankle dorsiflexion Lacking 5 to neutral WNL  Ankle plantarflexion    Ankle inversion    Ankle eversion     (Blank rows = not tested)  LOWER EXTREMITY MMT:  MMT Right eval Left eval  Hip flexion    Hip extension    Hip abduction    Hip adduction    Hip internal rotation    Hip external rotation    Knee flexion    Knee extension    Ankle dorsiflexion 2+ 5  Ankle plantarflexion 3 5  Ankle inversion 3 5  Ankle eversion 3 5   (Blank rows = not tested)  LOWER EXTREMITY SPECIAL TESTS:  DNT  FUNCTIONAL TESTS:  SLS: R - unable; L - 30 seconds  GAIT: Distance walked: 38ft Assistive device utilized:  Crutches Level of assistance: Modified independence Comments: started NWB on R LE - progressed via gait training to step-to gait with R LE in stance   TREATMENT:  [facility] Adult PT Treatment:                                                DATE: 09/07/23  Therapeutic Activity: Nu step L5 during subjective  STS 2x10 raised mat  Squat tap to mat 2x10 SLS w/ CW/CCW ball circles x10 each, BIL  SLS + 3 way tap RLE stance 2 x 10, second set without UE support  Obstacle Courses at Parallel Bars  Hurdle stepovers x4, 1 airex pad, 6in step   2 laps forward, 2 laps lateral Lateral  6inch runner step up RLE 2x10 weaning UE support  Retro lunges with slider 2 x8 each side      [facility] Adult PT Treatment:                                                DATE: 09/03/2023  Therapeutic Activity:  NuStep 5' x level 5  STS with equal WB with ASO brace 3 x 5  Standing cone taps (10) from airex 3 rounds x 10; SBA/supervison needed for safety Cone step overs to improve gait mechanics with ASO donned x 3 laps  Lateral hurdle step overs 2 x 30 sec (2 airex pads, 1 hurdle in between)  Tandem walking on airex beam x 3 laps (fwd only) Tandem stance on airex beam 2 x 30 sec  R SLS with CL task (soccer ball semicircles) 3 x 30 sec      PATIENT EDUCATION:  Education details: rationale for interventions, HEP  Person educated: Patient Education method: Explanation, Demonstration, Tactile cues, Verbal cues Education comprehension: verbalized understanding, returned demonstration, verbal cues required, tactile cues required, and needs further education     HOME EXERCISE PROGRAM: Access Code: [email or web address] URL: [email or web address] Date: 08/06/2023 Prepared by: Loral Roch  Exercises - Long Sitting Calf Stretch with Strap  - 2-3 x daily - 7 x weekly - 3 reps - 30 sec hold - Supine Ankle Pumps  - 2-3 x daily - 7 x weekly - 3 sets - 20 reps - Supine Ankle Circles  - 2-3 x daily - 7 x weekly - 3  sets - 20 reps - Side to Side Weight Shift with Counter Support  - 2-3  x daily - 7 x weekly - 2 sets - 10 reps - 5 sec hold - Ankle Dorsiflexion with Resistance  - 1 x daily - 7 x weekly - 2 sets - 10 reps - yellow hold - Ankle Inversion with Resistance  - 1 x daily - 7 x weekly - 2 sets - 10 reps - yellow hold - Ankle Eversion with Resistance  - 1 x daily - 7 x weekly - 2 sets - 10 reps - yellow hold - Ankle and Toe Plantarflexion with Resistance  - 1 x daily - 7 x weekly - 2 sets - 10 reps - yellow hold - Seated Heel Toe Raises  - 1 x daily - 7 x weekly - 2-3 sets - 15 reps  ASSESSMENT:  CLINICAL IMPRESSION: 09/15/2023 Patient continues to demonstrate improved overall function, including improved weight bearing tolerance. Progress activities to obstacle course with varying demands for even vs uneven surfaces, step ups, step overs, and increased SLS time. He requires intermittent UE support for safe performance. We will continue to progress towards higher-level activities, including weight lifting and jumping as appropriate for post-op status.    EVAL: Patient is a 23 y.o. M who was seen today for physical therapy evaluation and treatment s/p R ankle ORIF after bimalleolar fx on 06/16/2023. Physical findings are consistent with injury and recovery timeline as pt demonstrates decrease in R ankle strength, ROM, stability and gait. LEFS score shows severe disability in performance of home ADLs and higher level community activities. Pt would benefit from skilled PT services post op in order to get back to PLOF.    OBJECTIVE IMPAIRMENTS: Abnormal gait, decreased activity tolerance, decreased balance, decreased mobility, difficulty walking, decreased ROM, decreased strength, and pain  ACTIVITY LIMITATIONS: carrying, lifting, sitting, standing, squatting, stairs, transfers, and locomotion level  PARTICIPATION LIMITATIONS: meal prep, cleaning, driving, shopping, community activity, occupation, and yard  work  PERSONAL FACTORS: Time since onset of injury/illness/exacerbation are also affecting patient's functional outcome.   REHAB POTENTIAL: Excellent  CLINICAL DECISION MAKING: Stable/uncomplicated  EVALUATION COMPLEXITY: Low   GOALS: Goals reviewed with patient? No  SHORT TERM GOALS: Target date: 08/24/2023   Pt will be compliant and knowledgeable with initial HEP for improved comfort and carryover Baseline: initial HEP given  09/07/23: reports good HEP adherence Goal status: MET  2.  Pt will self report right ankle pain no greater than 1/10 for improved comfort and functional ability Baseline: 2/10 at worst 09/07/23: little to no pain over past week Goal status: MET  LONG TERM GOALS: Target date: 09/28/2023  Pt will improve LEFS to no less than 55/80 as proxy for functional improvement with home ADLs and higher level community activity Baseline: 31/80 Goal status: INITIAL   2.  Pt will self report right ankle pain no greater than 0/10 for improved comfort and functional ability Baseline: 2/10 at worst Goal status: INITIAL   3.  Pt will improve R ankle DF AROM to no less than 10 degrees for improved functional mobility and decrease pain Baseline: lacking 5 degrees Goal status: INITIAL  4.  Pt will improve R SLS hold time to no less than 30 seconds for improved ankle stability and getting back to PLOF Baseline: unable Goal status: INITIAL  5.  Pt will improve R ankle MMT to no less than 4/5 for all tested motions for improved functional ability and decrease pain Baseline: see MMT chart Goal status: INITIAL    PLAN:  PT FREQUENCY: 2x/week  PT DURATION:  8 weeks  PLANNED INTERVENTIONS: 97164- PT Re-evaluation, 97110-Therapeutic exercises, 97530- Therapeutic activity, [id number]- Neuromuscular re-education, 97535- Self Care, [id number]- Manual therapy, [id number]- Gait training, [phone]- Electrical stimulation (unattended), [id number]- Electrical stimulation (manual), 97016- Vasopneumatic  device, Dry Needling, Cryotherapy, and Moist heat  PLAN FOR NEXT SESSION: assess HEP resposne, gait training, ankle strengthening, progress boot>ASO brace   Arlester Bence, PT, DPT  09/15/2023 2:48 PM

## 2023-09-28 ENCOUNTER — Ambulatory Visit: Payer: Self-pay | Attending: Physician Assistant

## 2023-09-28 DIAGNOSIS — M6281 Muscle weakness (generalized): Secondary | ICD-10-CM | POA: Insufficient documentation

## 2023-09-28 DIAGNOSIS — M25571 Pain in right ankle and joints of right foot: Secondary | ICD-10-CM | POA: Insufficient documentation

## 2023-09-28 DIAGNOSIS — R600 Localized edema: Secondary | ICD-10-CM | POA: Insufficient documentation

## 2023-09-28 DIAGNOSIS — R2689 Other abnormalities of gait and mobility: Secondary | ICD-10-CM | POA: Insufficient documentation

## 2023-09-28 NOTE — Therapy (Signed)
 OUTPATIENT PHYSICAL THERAPY TREATMENT   Patient Name: Keith Mcconnell MRN: [id number] DOB:2000-09-10, 23 y.o., male Today's Date: 09/28/2023  END OF SESSION:    Past Medical History:  Diagnosis Date   ADHD    no meds   COVID-19 2021   Heart murmur    never has caused any problems   Hx of seasonal allergies    Past Surgical History:  Procedure Laterality Date   ORIF ANKLE FRACTURE Right 06/17/2023   Procedure: OPEN REDUCTION INTERNAL FIXATION (ORIF) ANKLE FRACTURE;  Surgeon: Jerri Kay HERO, MD;  Location: [facility] OR;  Service: Orthopedics;  Laterality: Right;  ORIF RIGHT BIMALLEOLAR FRACTURE   TONSILLECTOMY     WISDOM TOOTH EXTRACTION     Patient Active Problem List   Diagnosis Date Noted   Bimalleolar ankle fracture, right, closed, initial encounter 06/16/2023    PCP: [facility]  REFERRING PROVIDER: Jule Ronal CROME, PA-C  REFERRING DIAG: [phone] (ICD-10-CM) - Bimalleolar ankle fracture, right, closed, initial encounter   THERAPY DIAG:  Pain in right ankle and joints of right foot  Other abnormalities of gait and mobility  Muscle weakness (generalized)  Localized edema  Rationale for Evaluation and Treatment: Rehabilitation  ONSET DATE: 06/08/2023 - DOS 06/17/2023  SUBJECTIVE:   SUBJECTIVE STATEMENT:  09/28/2023 Patient reports that he has been feeling good overall, but continues to have soreness that he is curious about the expected timeframe before it gets better.    EVAL: Pt presents to PT s/p R ankle ORIF post bimalleolar fx on 06/17/2023. Notes decrease in pain over recent weeks but has been nervous to start putting weight through his R LE. Denies N/T in R foot, does feel like it is getting tight.   PERTINENT HISTORY: ORIF R ankle 06/17/2023  PAIN:  Are you having pain?  Yes: NPRS scale: 0/10   Worst: 2/10 Pain location: R lateral ankle Pain description: tight, sore Aggravating factors: standing, sitting Relieving factors: rest,  ice  PRECAUTIONS: None  RED FLAGS: None   WEIGHT BEARING RESTRICTIONS: WBAT in CAM boot  FALLS:  Has patient fallen in last 6 months? No  LIVING ENVIRONMENT: Lives with: lives with their family Lives in: House/apartment  OCCUPATION: Not currently working  PLOF: Independent  PATIENT GOALS: get back to PLOF and weight lifting  NEXT MD VISIT: Not currently scheduled  OBJECTIVE:  Note: Objective measures were completed at Evaluation unless otherwise noted.  DIAGNOSTIC FINDINGS: See imaging  PATIENT SURVEYS:   LEFS: 31/80  COGNITION: Overall cognitive status: Within functional limits for tasks assessed     SENSATION: WFL   POSTURE: flexed trunk   PALPATION: TTP to R fibularis longus/brevis   LOWER EXTREMITY ROM:  Active ROM Right eval Left eval  Hip flexion    Hip extension    Hip abduction    Hip adduction    Hip internal rotation    Hip external rotation    Knee flexion    Knee extension    Ankle dorsiflexion Lacking 5 to neutral WNL  Ankle plantarflexion    Ankle inversion    Ankle eversion     (Blank rows = not tested)  LOWER EXTREMITY MMT:  MMT Right eval Left eval  Hip flexion    Hip extension    Hip abduction    Hip adduction    Hip internal rotation    Hip external rotation    Knee flexion    Knee extension    Ankle dorsiflexion 2+ 5  Ankle plantarflexion 3 5  Ankle inversion 3 5  Ankle eversion 3 5   (Blank rows = not tested)  LOWER EXTREMITY SPECIAL TESTS:  DNT  FUNCTIONAL TESTS:  SLS: R - unable; L - 30 seconds  GAIT: Distance walked: 7ft Assistive device utilized: Crutches Level of assistance: Modified independence Comments: started NWB on R LE - progressed via gait training to step-to gait with R LE in stance   TREATMENT:  [facility] Adult PT Treatment:                                                DATE: 09/28/2023  Therapeutic Activity: Nu step L5 during subjective  Squat 2x10 SLS + 3 way tap RLE stance 2 x  10, second set without UE support  Heel raises, up 1 sec, eccentric lowering x 3 sec; 2 x 10  Staggered Stance 2 x 10  Obstacle Courses at Parallel Bars  Hurdle stepovers x4, 1 airex pad, 6in step   2 laps forward, 2 laps lateral Lateral  6inch runner step up RLE 2x10 weaning UE support  Retro lunges with slider 2 x8 each side    [facility] Adult PT Treatment:                                                DATE: 09/07/23  Therapeutic Activity: Nu step L5 during subjective  STS 2x10 raised mat  Squat tap to mat 2x10 SLS w/ CW/CCW ball circles x10 each, BIL  SLS + 3 way tap RLE stance 2 x 10, second set without UE support  Obstacle Courses at Parallel Bars  Hurdle stepovers x4, 1 airex pad, 6in step   2 laps forward, 2 laps lateral Lateral  6inch runner step up RLE 2x10 weaning UE support  Retro lunges with slider 2 x8 each side      [facility] Adult PT Treatment:                                                DATE: 09/03/2023  Therapeutic Activity:  NuStep 5' x level 5  STS with equal WB with ASO brace 3 x 5  Standing cone taps (10) from airex 3 rounds x 10; SBA/supervison needed for safety Cone step overs to improve gait mechanics with ASO donned x 3 laps  Lateral hurdle step overs 2 x 30 sec (2 airex pads, 1 hurdle in between)  Tandem walking on airex beam x 3 laps (fwd only) Tandem stance on airex beam 2 x 30 sec  R SLS with CL task (soccer ball semicircles) 3 x 30 sec      PATIENT EDUCATION:  Education details: rationale for interventions, HEP  Person educated: Patient Education method: Explanation, Demonstration, Tactile cues, Verbal cues Education comprehension: verbalized understanding, returned demonstration, verbal cues required, tactile cues required, and needs further education     HOME EXERCISE PROGRAM: Access Code: [email or web address] URL: [email or web address] Date: 08/06/2023 Prepared by: Alm Kingdom  Exercises - Long Sitting Calf Stretch with Strap  -  2-3 x daily - 7 x weekly - 3  reps - 30 sec hold - Supine Ankle Pumps  - 2-3 x daily - 7 x weekly - 3 sets - 20 reps - Supine Ankle Circles  - 2-3 x daily - 7 x weekly - 3 sets - 20 reps - Side to Side Weight Shift with Counter Support  - 2-3 x daily - 7 x weekly - 2 sets - 10 reps - 5 sec hold - Ankle Dorsiflexion with Resistance  - 1 x daily - 7 x weekly - 2 sets - 10 reps - yellow hold - Ankle Inversion with Resistance  - 1 x daily - 7 x weekly - 2 sets - 10 reps - yellow hold - Ankle Eversion with Resistance  - 1 x daily - 7 x weekly - 2 sets - 10 reps - yellow hold - Ankle and Toe Plantarflexion with Resistance  - 1 x daily - 7 x weekly - 2 sets - 10 reps - yellow hold - Seated Heel Toe Raises  - 1 x daily - 7 x weekly - 2-3 sets - 15 reps  ASSESSMENT:  CLINICAL IMPRESSION: 09/28/2023 Eva had good tolerance of today's treatment session, which focused on progression of functional LE strengthening activities in preparation for jumping, running and return to weight lifting. Patient had some difficulty with squats and staggered stance heel raises. Plan is to reassess progress towards goals at next visit and update POC as indicated.  EVAL: Patient is a 23 y.o. M who was seen today for physical therapy evaluation and treatment s/p R ankle ORIF after bimalleolar fx on 06/16/2023. Physical findings are consistent with injury and recovery timeline as pt demonstrates decrease in R ankle strength, ROM, stability and gait. LEFS score shows severe disability in performance of home ADLs and higher level community activities. Pt would benefit from skilled PT services post op in order to get back to PLOF.    OBJECTIVE IMPAIRMENTS: Abnormal gait, decreased activity tolerance, decreased balance, decreased mobility, difficulty walking, decreased ROM, decreased strength, and pain  ACTIVITY LIMITATIONS: carrying, lifting, sitting, standing, squatting, stairs, transfers, and locomotion level  PARTICIPATION  LIMITATIONS: meal prep, cleaning, driving, shopping, community activity, occupation, and yard work  PERSONAL FACTORS: Time since onset of injury/illness/exacerbation are also affecting patient's functional outcome.   REHAB POTENTIAL: Excellent  CLINICAL DECISION MAKING: Stable/uncomplicated  EVALUATION COMPLEXITY: Low   GOALS: Goals reviewed with patient? No  SHORT TERM GOALS: Target date: 08/24/2023   Pt will be compliant and knowledgeable with initial HEP for improved comfort and carryover Baseline: initial HEP given  09/07/23: reports good HEP adherence Goal status: MET  2.  Pt will self report right ankle pain no greater than 1/10 for improved comfort and functional ability Baseline: 2/10 at worst 09/07/23: little to no pain over past week Goal status: MET  LONG TERM GOALS: Target date: 09/28/2023  Pt will improve LEFS to no less than 55/80 as proxy for functional improvement with home ADLs and higher level community activity Baseline: 31/80 Goal status: INITIAL   2.  Pt will self report right ankle pain no greater than 0/10 for improved comfort and functional ability Baseline: 2/10 at worst Goal status: INITIAL   3.  Pt will improve R ankle DF AROM to no less than 10 degrees for improved functional mobility and decrease pain Baseline: lacking 5 degrees Goal status: INITIAL  4.  Pt will improve R SLS hold time to no less than 30 seconds for improved ankle stability and getting back to PLOF Baseline:  unable Goal status: INITIAL  5.  Pt will improve R ankle MMT to no less than 4/5 for all tested motions for improved functional ability and decrease pain Baseline: see MMT chart Goal status: INITIAL    PLAN:  PT FREQUENCY: 2x/week  PT DURATION: 8 weeks  PLANNED INTERVENTIONS: 97164- PT Re-evaluation, 97110-Therapeutic exercises, 97530- Therapeutic activity, [id number]- Neuromuscular re-education, 97535- Self Care, [id number]- Manual therapy, [id number]- Gait training, [id number]-  Electrical stimulation (unattended), [id number]- Electrical stimulation (manual), 97016- Vasopneumatic device, Dry Needling, Cryotherapy, and Moist heat  PLAN FOR NEXT SESSION: assess progress towards established goals, update POC; continue to progress as appropriate for post op status    Marko Molt, PT, DPT  10/03/2023 1:58 PM

## 2023-10-06 ENCOUNTER — Ambulatory Visit: Payer: Self-pay | Admitting: Physical Therapy

## 2023-10-06 DIAGNOSIS — R2689 Other abnormalities of gait and mobility: Secondary | ICD-10-CM

## 2023-10-06 DIAGNOSIS — M6281 Muscle weakness (generalized): Secondary | ICD-10-CM

## 2023-10-06 DIAGNOSIS — R600 Localized edema: Secondary | ICD-10-CM

## 2023-10-06 DIAGNOSIS — M25571 Pain in right ankle and joints of right foot: Secondary | ICD-10-CM

## 2023-10-06 NOTE — Therapy (Addendum)
 OUTPATIENT PHYSICAL THERAPY TREATMENT   Patient Name: Keith Mcconnell MRN: [id number] DOB:2000-04-25, 23 y.o., male Today's Date: 10/06/2023   PHYSICAL THERAPY DISCHARGE SUMMARY  Visits from Start of Care: 11  Current functional level related to goals / functional outcomes: see assessment   Remaining deficits: see assessment   Education / Equipment: see assessment   Patient agrees to discharge. Patient goals were partially met. Patient is being discharged due to not returning since the last visit.  Mabel Kiang, PT, DPT 12/13/2023, 10:54 AM    END OF SESSION:  PT End of Session - 10/06/23 1402     Visit Number 11    Number of Visits 17    Date for PT Re-Evaluation 11/17/23    Authorization Type self pay    PT Start Time 1400    PT Stop Time 1438    PT Time Calculation (min) 38 min           Past Medical History:  Diagnosis Date   ADHD    no meds   COVID-19 2021   Heart murmur    never has caused any problems   Hx of seasonal allergies    Past Surgical History:  Procedure Laterality Date   ORIF ANKLE FRACTURE Right 06/17/2023   Procedure: OPEN REDUCTION INTERNAL FIXATION (ORIF) ANKLE FRACTURE;  Surgeon: Jerri Kay HERO, MD;  Location: [facility] OR;  Service: Orthopedics;  Laterality: Right;  ORIF RIGHT BIMALLEOLAR FRACTURE   TONSILLECTOMY     WISDOM TOOTH EXTRACTION     Patient Active Problem List   Diagnosis Date Noted   Bimalleolar ankle fracture, right, closed, initial encounter 06/16/2023    PCP: [facility]  REFERRING PROVIDER: Jule Ronal CROME, PA-C  REFERRING DIAG: [phone] (ICD-10-CM) - Bimalleolar ankle fracture, right, closed, initial encounter   THERAPY DIAG:  Pain in right ankle and joints of right foot - Plan: PT plan of care cert/re-cert  Other abnormalities of gait and mobility - Plan: PT plan of care cert/re-cert  Muscle weakness (generalized) - Plan: PT plan of care cert/re-cert  Localized edema - Plan: PT  plan of care cert/re-cert  Rationale for Evaluation and Treatment: Rehabilitation  ONSET DATE: 06/08/2023 - DOS 06/17/2023  SUBJECTIVE:   SUBJECTIVE STATEMENT: Pt attended today's session with reports of 0/10 pain, just pressure. Pt stated that they have maintained poor compliance with current HEP.  Wants to get back to leg pressing, running, and jumping.    EVAL: Pt presents to PT s/p R ankle ORIF post bimalleolar fx on 06/17/2023. Notes decrease in pain over recent weeks but has been nervous to start putting weight through his R LE. Denies N/T in R foot, does feel like it is getting tight.   PERTINENT HISTORY: ORIF R ankle 06/17/2023  PAIN:  Are you having pain?  Yes: NPRS scale: 0/10   Worst: 2/10 Pain location: R lateral ankle Pain description: tight, sore Aggravating factors: standing, sitting Relieving factors: rest, ice  PRECAUTIONS: None  RED FLAGS: None   WEIGHT BEARING RESTRICTIONS: WBAT in CAM boot  FALLS:  Has patient fallen in last 6 months? No  LIVING ENVIRONMENT: Lives with: lives with their family Lives in: House/apartment  OCCUPATION: Not currently working  PLOF: Independent  PATIENT GOALS: get back to PLOF and weight lifting  NEXT MD VISIT: Not currently scheduled  OBJECTIVE:  Note: Objective measures were completed at Evaluation unless otherwise noted.  DIAGNOSTIC FINDINGS: See imaging  PATIENT SURVEYS:   LEFS: 31/80  COGNITION: Overall cognitive status: Within functional limits for tasks assessed     SENSATION: WFL   POSTURE: flexed trunk   PALPATION: TTP to R fibularis longus/brevis   LOWER EXTREMITY ROM:  Active ROM Right eval 10/06/2023 Left eval  Hip flexion     Hip extension     Hip abduction     Hip adduction     Hip internal rotation     Hip external rotation     Knee flexion     Knee extension     Ankle dorsiflexion Lacking 5 to neutral 10d WNL  Ankle plantarflexion     Ankle inversion     Ankle eversion       (Blank rows = not tested)  LOWER EXTREMITY MMT:  MMT Right eval 10/06/2023 Left eval  Hip flexion     Hip extension     Hip abduction     Hip adduction     Hip internal rotation     Hip external rotation     Knee flexion     Knee extension     Ankle dorsiflexion 2+ 4+ 5  Ankle plantarflexion 3 4+ 5  Ankle inversion 3 4+ 5  Ankle eversion 3 4+ 5   (Blank rows = not tested)  LOWER EXTREMITY SPECIAL TESTS:  DNT  FUNCTIONAL TESTS:  SLS: R - unable; L - 30 seconds 10/06/2023: 10s RLE  30s step test: 20  Jump and land: 18 inches from floor 0/10 pain.  GAIT: Distance walked: 103ft Assistive device utilized: Crutches Level of assistance: Modified independence Comments: started NWB on R LE - progressed via gait training to step-to gait with R LE in stance   TREATMENT: [facility] Adult PT Treatment:                                                DATE: 10/06/2023 Therapeutic Activity: Objective testing ROM, Strength, functional testing for running/jumping Self Care: HEP update POC discussion Pt education   [facility] Adult PT Treatment:                                                DATE: 09/28/2023  Therapeutic Activity: Nu step L5 during subjective  Squat 2x10 SLS + 3 way tap RLE stance 2 x 10, second set without UE support  Heel raises, up 1 sec, eccentric lowering x 3 sec; 2 x 10  Staggered Stance 2 x 10  Obstacle Courses at Parallel Bars  Hurdle stepovers x4, 1 airex pad, 6in step   2 laps forward, 2 laps lateral Lateral  6inch runner step up RLE 2x10 weaning UE support  Retro lunges with slider 2 x8 each side    [facility] Adult PT Treatment:                                                DATE: 09/07/23  Therapeutic Activity: Nu step L5 during subjective  STS 2x10 raised mat  Squat tap to mat 2x10 SLS w/ CW/CCW ball circles x10 each, BIL  SLS + 3 way tap RLE stance  2 x 10, second set without UE support  Obstacle Courses at Parallel Bars  Hurdle stepovers x4, 1 airex  pad, 6in step   2 laps forward, 2 laps lateral Lateral  6inch runner step up RLE 2x10 weaning UE support  Retro lunges with slider 2 x8 each side      [facility] Adult PT Treatment:                                                DATE: 09/03/2023  Therapeutic Activity:  NuStep 5' x level 5  STS with equal WB with ASO brace 3 x 5  Standing cone taps (10) from airex 3 rounds x 10; SBA/supervison needed for safety Cone step overs to improve gait mechanics with ASO donned x 3 laps  Lateral hurdle step overs 2 x 30 sec (2 airex pads, 1 hurdle in between)  Tandem walking on airex beam x 3 laps (fwd only) Tandem stance on airex beam 2 x 30 sec  R SLS with CL task (soccer ball semicircles) 3 x 30 sec      PATIENT EDUCATION:  Education details: rationale for interventions, HEP  Person educated: Patient Education method: Explanation, Demonstration, Tactile cues, Verbal cues Education comprehension: verbalized understanding, returned demonstration, verbal cues required, tactile cues required, and needs further education     HOME EXERCISE PROGRAM: Access Code: [email or web address] URL: [email or web address] Date: 10/06/2023 Prepared by: Mabel Kiang  Exercises - Single leg stance in corner  - 1-2 x daily - 7 x weekly - 2-3 sets - 1 reps - 74m hold - Walking with High Knee Taps  - 1 x daily - 7 x weekly - 2-3 sets - 20 reps - Gastroc Heel Raise in Stride Stance Position  - 1 x daily - 5 x weekly - 3 sets - 12 reps - 2s hold ASSESSMENT:  CLINICAL IMPRESSION: Pt attended physical therapy session for re-evaluation of R bimalleolar fx w/ORIF. Pt has met 6 goals and continues to work towards 1 other, 3 new goals were added to promote higher level function and return to recreational activity. Difficulties continue with SLS on RLE, force absorption, RLE strength, jogging mechanics, and Gastrocnemius force production. Pt required moderate cuing as well as minimal physical assistance for safe and  appropriate performance of today's activities. Continue with therapeutic focus on BLE strength, SLS balance, R ankle stability, gradual progression of running/jumping. HEP was updated to reflect current POC. Education was given to continue applying ADL education from previous sessions as well as performing HEP as prescribed with freedom to progress as tolerated using previous education on modification and exercise dosage. Pt has displayed and verbalized competence regarding this education.   Pt continues to require the intervention of skilled outpatient physical therapy to address the aforementioned deficits and progress towards a functional level in line with therapeutic goals.   EVAL: Patient is a 23 y.o. M who was seen today for physical therapy evaluation and treatment s/p R ankle ORIF after bimalleolar fx on 06/16/2023. Physical findings are consistent with injury and recovery timeline as pt demonstrates decrease in R ankle strength, ROM, stability and gait. LEFS score shows severe disability in performance of home ADLs and higher level community activities. Pt would benefit from skilled PT services post op in order to get back to PLOF.    OBJECTIVE IMPAIRMENTS: Abnormal gait, decreased activity  tolerance, decreased balance, decreased mobility, difficulty walking, decreased ROM, decreased strength, and pain  ACTIVITY LIMITATIONS: carrying, lifting, sitting, standing, squatting, stairs, transfers, and locomotion level  PARTICIPATION LIMITATIONS: meal prep, cleaning, driving, shopping, community activity, occupation, and yard work  PERSONAL FACTORS: Time since onset of injury/illness/exacerbation are also affecting patient's functional outcome.   REHAB POTENTIAL: Excellent  CLINICAL DECISION MAKING: Stable/uncomplicated  EVALUATION COMPLEXITY: Low   GOALS: Goals reviewed with patient? No  SHORT TERM GOALS: Target date: 08/24/2023   Pt will be compliant and knowledgeable with initial HEP  for improved comfort and carryover Baseline: initial HEP given  09/07/23: reports good HEP adherence Goal status: MET  2.  Pt will self report right ankle pain no greater than 1/10 for improved comfort and functional ability Baseline: 2/10 at worst 09/07/23: little to no pain over past week Goal status: MET  LONG TERM GOALS: Target date: 11/17/2023  Pt will improve LEFS to no less than 55/80 as proxy for functional improvement with home ADLs and higher level community activity Baseline: 31/80 10/06/2023: 60/80 Goal status: MET 10/06/2023  2.  Pt will self report right ankle pain no greater than 0/10 for improved comfort and functional ability Baseline: 2/10 at worst Goal status: MET 10/06/2023  3.  Pt will improve R ankle DF AROM to no less than 10 degrees for improved functional mobility and decrease pain Baseline: lacking 5 degrees Goal status:MET 10/06/2023  4.  Pt will improve R SLS hold time to no less than 30 seconds for improved ankle stability and getting back to PLOF Baseline: unable Goal status: PROGRESSING 10s RLE  5.  Pt will improve R ankle MMT to no less than 4/5 for all tested motions for improved functional ability and decrease pain Baseline: see MMT chart Goal status: MET  6. Pt will improve 30s step test to 30 to demonstrate improved SL WB capacity, Improved BLE coordination, BLE strength, and BLE force absorption tolerance.  Baseline: 20  Goal Status: INITIAL 10/06/2023  7.  Pt will independently ambulate at a speed with no AD or LOB for 5 minutes to demonstrate improved weightbearing tolerance, BLE strength, and functional capacity for community ambulation.  Baseline: 3.8 MPH, BUE a. No LOB,  Goal status: INITIAL 10/06/2023  8. Pt will demonstrate a jump to 24 inches and land with 0/10 pain no lateral ankle movement to demonstrate high quality ankle stability, improved BLE force production, and BLE force absorption for full return to recreational  activity.  Baseline: 18 inches, lateral movement with independent recovery, 0/10 pain  Goal status: INITIAL 10/06/2023    PLAN:  PT FREQUENCY: 2x/week  PT DURATION: 8 weeks  PLANNED INTERVENTIONS: 97164- PT Re-evaluation, 97110-Therapeutic exercises, 97530- Therapeutic activity, 97112- Neuromuscular re-education, 97535- Self Care, [id number]- Manual therapy, [id number]- Gait training, [phone]- Electrical stimulation (unattended), [id number]- Electrical stimulation (manual), 97016- Vasopneumatic device, Dry Needling, Cryotherapy, and Moist heat  PLAN FOR NEXT SESSION: Continue with therapeutic focus on BLE strength, SLS balance, R ankle stability, gradual progression of running/jumping. HEP was updated to reflect current POC. Review next session.   Mabel Kiang, PT, DPT 10/06/2023, 3:04 PM

## 2023-10-13 ENCOUNTER — Telehealth: Payer: Self-pay | Admitting: Physical Therapy

## 2023-10-13 ENCOUNTER — Ambulatory Visit: Payer: Self-pay | Admitting: Physical Therapy

## 2023-10-13 NOTE — Telephone Encounter (Signed)
 An attempt was made to contact pt via phone call at 2:31 PM regarding missed appointment at 1400. Pts voicemail box is full and no message could be left giving education.  Mabel Kiang, PT, DPT 10/13/2023, 2:31 PM

## 2024-01-31 ENCOUNTER — Encounter: Payer: Self-pay | Admitting: Radiology
